# Patient Record
Sex: Female | Born: 1990 | Race: Black or African American | Hispanic: No | Marital: Single | Smoking: Never smoker
Health system: Southern US, Community
[De-identification: ages and names within clinical notes are randomized; demographics above are authoritative.]

## PROBLEM LIST (undated history)

## (undated) DIAGNOSIS — L910 Hypertrophic scar: Secondary | ICD-10-CM

## (undated) DIAGNOSIS — Z789 Other specified health status: Secondary | ICD-10-CM

## (undated) HISTORY — PX: NO PAST SURGERIES: SHX2092

## (undated) HISTORY — DX: Other specified health status: Z78.9

---
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
## 2012-09-04 IMAGING — US US RENAL KIDNEY
1 series · 17 of 25 positions shown · non-contrast
Comparison: None

REASON FOR EXAM: pain - left flank/back - hematuria - eval for stone
COMMENTS:

PROCEDURE:     US  - US KIDNEY  - March 02, 2011  [DATE]
RESULT:     Indication: Left flank pain

[Series 1: us renal kidney · 17 of 47 slices shown]
[im 1/47]
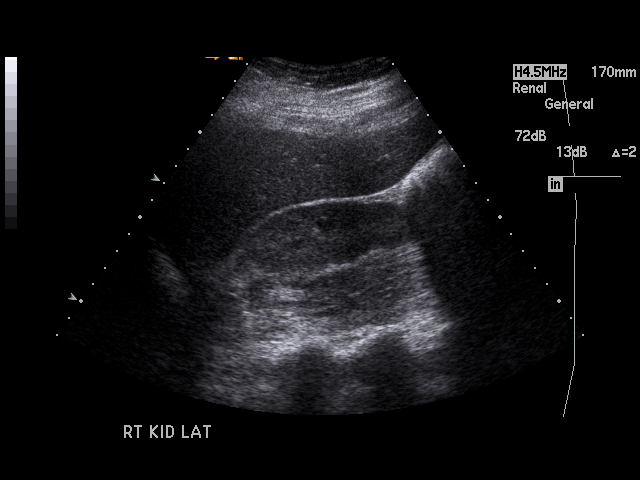
[im 4/47]
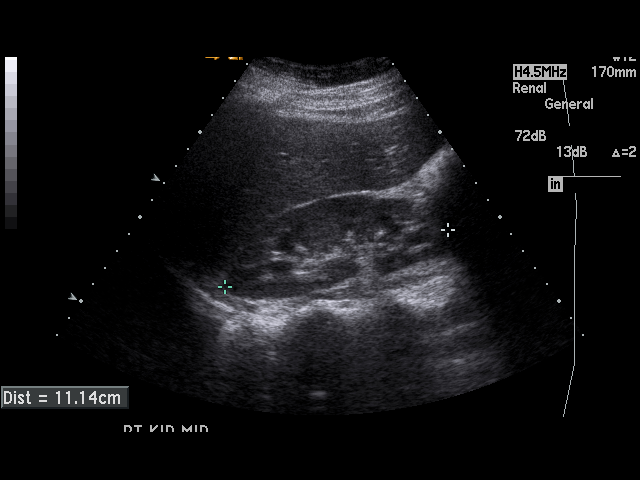
[im 6/47]
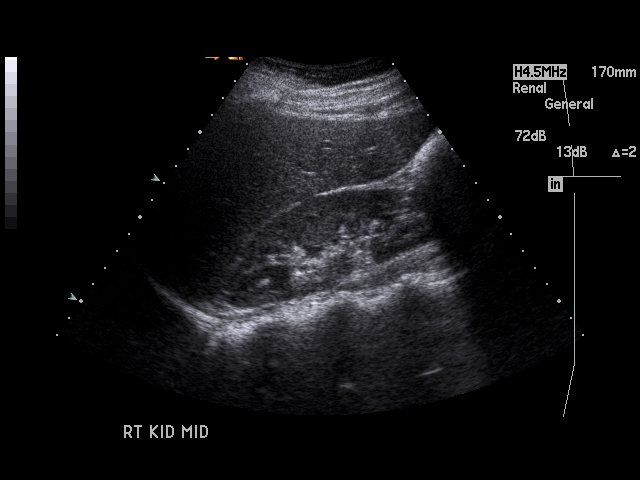
[im 10/47]
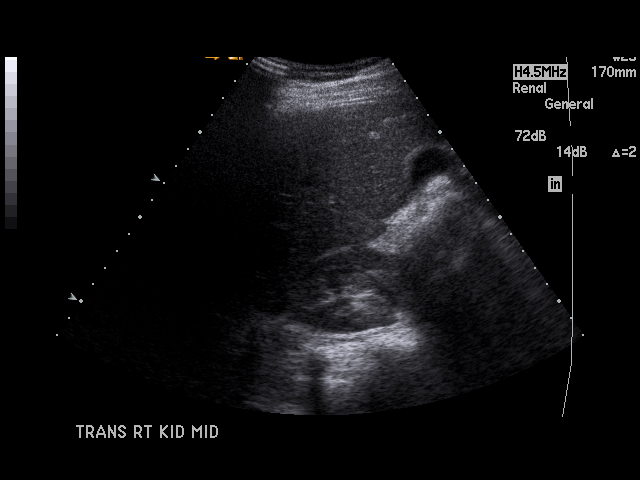
[im 12/47]
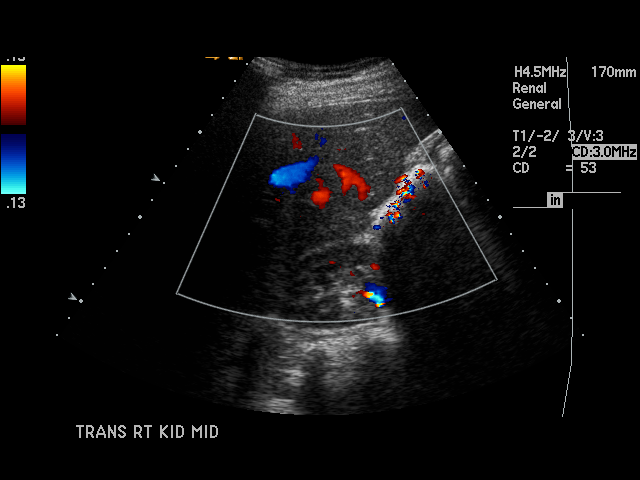
[im 16/47]
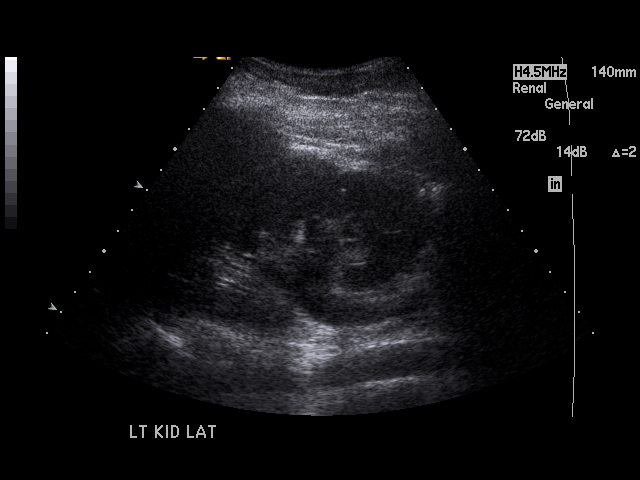
[im 18/47]
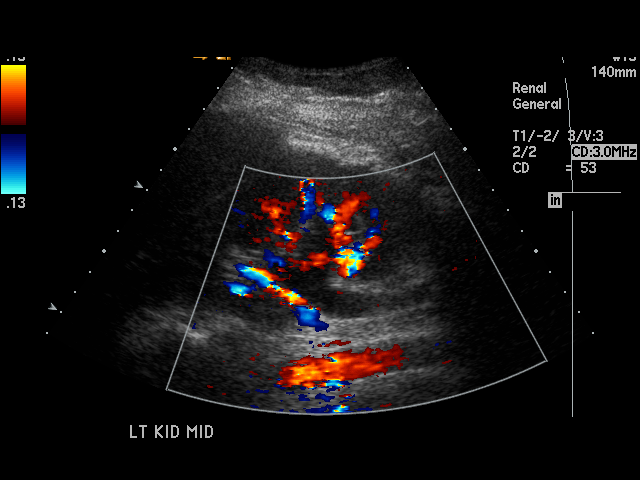
[im 22/47]
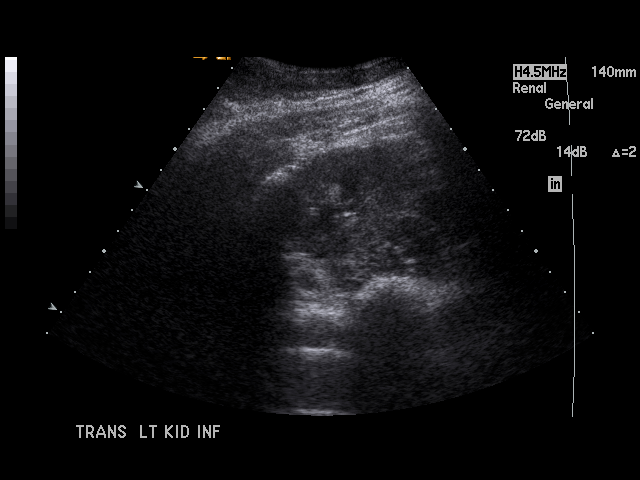
[im 24/47]
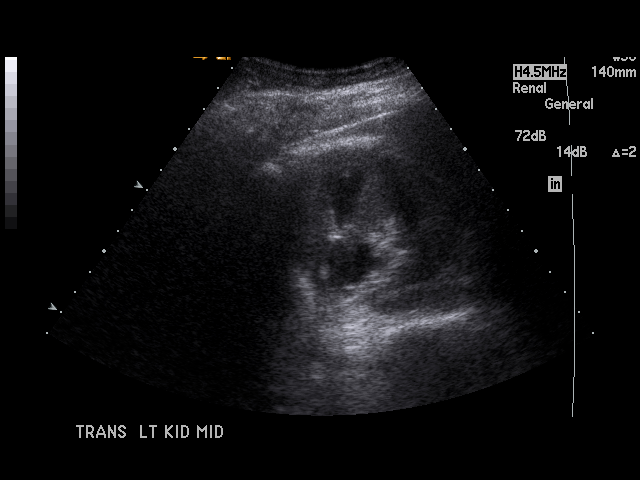
[im 25/47]
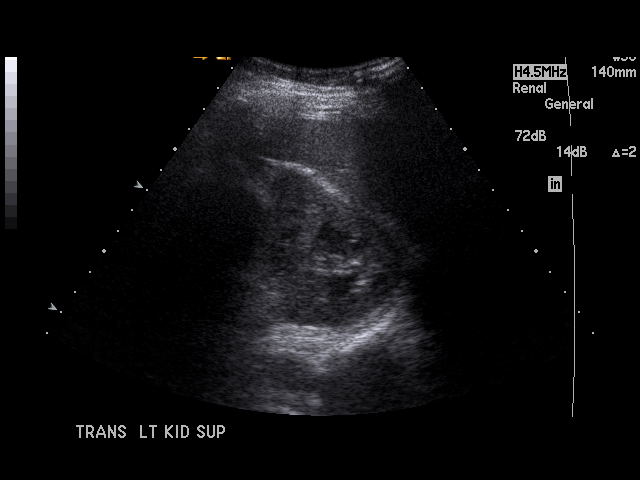
[im 29/47]
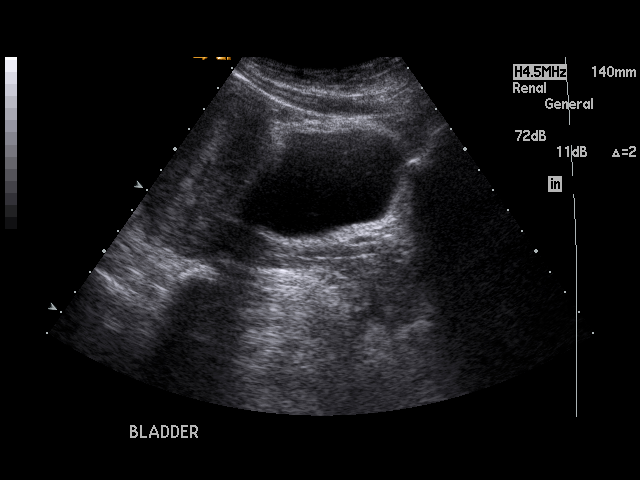
[im 31/47]
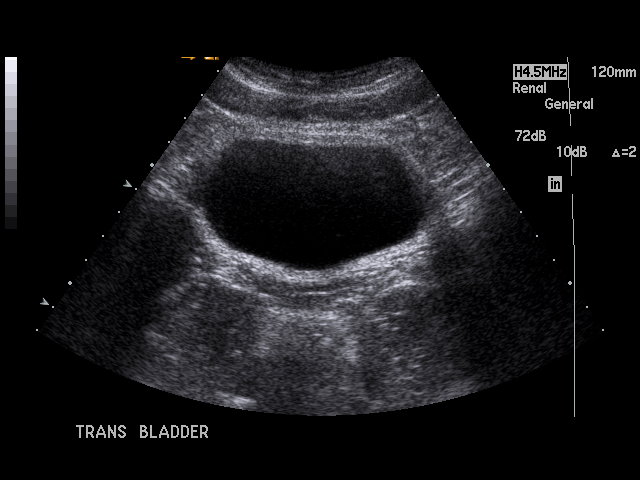
[im 35/47]
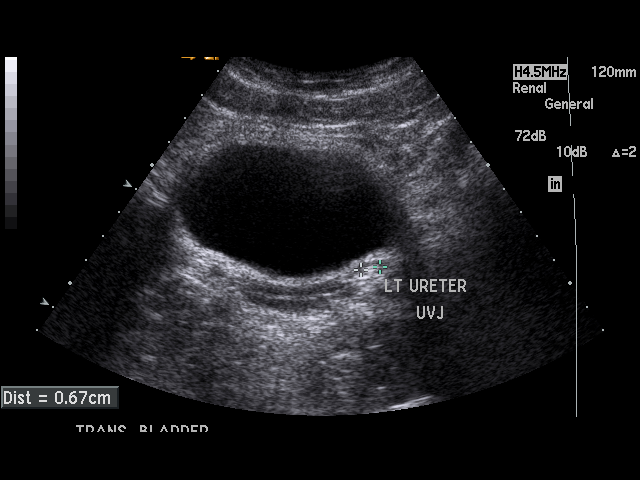
[im 37/47]
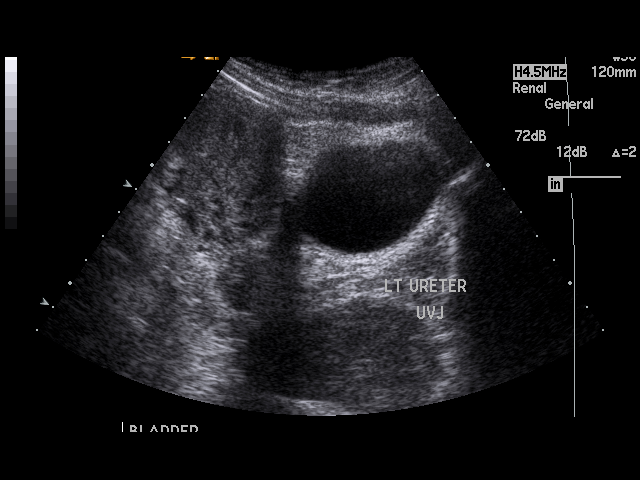
[im 41/47]
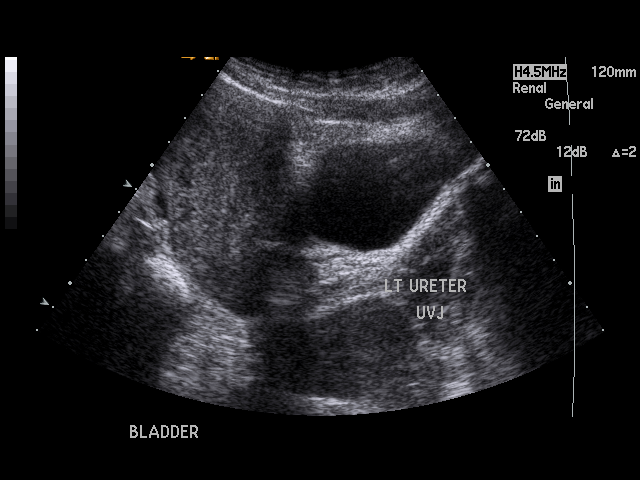
[im 43/47]
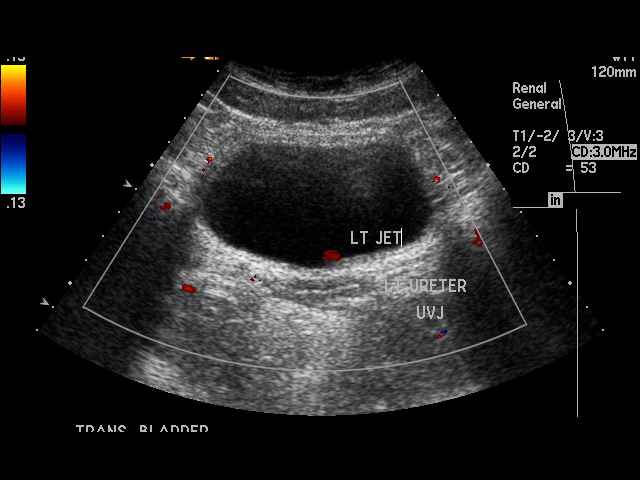
[im 47/47]
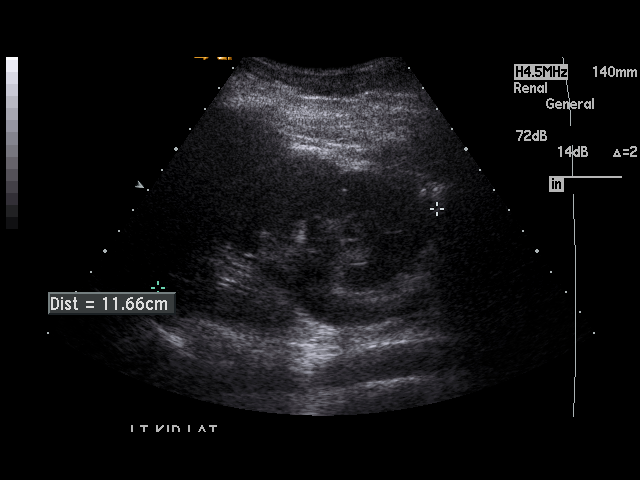

[17 of 25 positions shown; findings below may reference images not displayed]

Technique and findings: Multiple gray-scale and color doppler images of the
kidneys were obtained.

The right kidney measures 11.1 x 4.3 x 5.2 cm and the left kidney measures
11.7 x 7 x 6.5 for cm. The kidneys are normal in echogenicity. There is a 7
mm echogenic focus in the region of the left ureterovesicular junction most
consistent with a left UVJ calculus measuring 7 mm. There is mild-moderate
left hydronephrosis.  There are no renal masses. There is no free fluid in
the region of the renal fossa.
IMPRESSION: 7 mm left ureterovesicular junction calculus resulting in mild-moderate
hydronephrosis.

## 2017-01-28 ENCOUNTER — Emergency Department
Admission: EM | Admit: 2017-01-28 | Discharge: 2017-01-28 | Disposition: A | Discharge status: Home / Self Care | Payer: Self-pay | Attending: Emergency Medicine | Admitting: Emergency Medicine

## 2017-01-28 ENCOUNTER — Encounter: Payer: Self-pay | Admitting: Emergency Medicine

## 2017-01-28 DIAGNOSIS — H60391 Other infective otitis externa, right ear: Secondary | ICD-10-CM | POA: Insufficient documentation

## 2017-01-28 DIAGNOSIS — L910 Hypertrophic scar: Secondary | ICD-10-CM | POA: Insufficient documentation

## 2017-01-28 HISTORY — DX: Hypertrophic scar: L91.0

## 2017-01-28 MED ORDER — CEPHALEXIN 500 MG PO CAPS: 500.0000 mg | ORAL_CAPSULE | Freq: Three times a day (TID) | ORAL | 0 refills | Status: DC

## 2017-01-28 MED ORDER — FLUCONAZOLE 150 MG PO TABS: ORAL_TABLET | ORAL | 0 refills | Status: DC

## 2017-01-28 NOTE — ED Triage Notes (Signed)
Pt has known keloid to right ear that has gotten larger and is painful.  Has not seen ENT.  NAD. Ambulatory without difficulty.  No fevers.

## 2017-01-28 NOTE — ED Provider Notes (Signed)
[facility] Emergency Department Provider Note   ____________________________________________   First MD Initiated Contact with Patient 01/28/17 1320     (approximate)  I have reviewed the triage vital signs and the nursing notes.   HISTORY  Chief Complaint keloid   HPI Angel Hodges is a 26 y.o. female . Complaint of tender, more swollen keloid to her right earlobe. Patient states that this began 2 days ago. She has never had pain or discomfort such as this in the past. She denies any recent injury to the area. She is unaware of any fever or chills. Currently she rates her pain as 5/10.   Past Medical History:  Diagnosis Date  . Keloid     There are no active problems to display for this patient.   History reviewed. No pertinent surgical history.  Prior to Admission medications   Medication Sig Start Date End Date Taking? Authorizing Provider  cephALEXin (KEFLEX) 500 MG capsule Take 1 capsule (500 mg total) by mouth 3 (three) times daily. 01/28/17   Tommi Rumps, PA-C  fluconazole (DIFLUCAN) 150 MG tablet One tablet if needed for yeast infection 01/28/17   Tommi Rumps, PA-C    Allergies Patient has no known allergies.  History reviewed. No pertinent family history.  Social History Social History  Substance Use Topics  . Smoking status: Never Smoker  . Smokeless tobacco: Never Used  . Alcohol use No    Review of Systems Constitutional: No fever/chills Eyes: No visual changes. ENT: Positive for right leg pain. Cardiovascular: Denies chest pain. Respiratory: Denies shortness of breath. Skin: Positive for keloid. Neurological: Negative for headaches ___________________________________________   PHYSICAL EXAM:  VITAL SIGNS: ED Triage Vitals  Enc Vitals Group     BP 01/28/17 1258 132/73     Pulse Rate 01/28/17 1256 78     Resp 01/28/17 1256 18     Temp 01/28/17 1256 99.4 F (37.4 C)     Temp Source 01/28/17 1256  Oral     SpO2 01/28/17 1256 100 %     Weight 01/28/17 1257 250 lb (113.4 kg)     Height 01/28/17 1257  (1.702 m)     Head Circumference --      Peak Flow --      Pain Score 01/28/17 1256 5     Pain Loc --      Pain Edu? --      Excl. in GC? --    Constitutional: Alert and oriented. Well appearing and in no acute distress. Eyes: Conjunctivae are normal.  Head: Atraumatic. Nose: No congestion/rhinnorhea. Neck: No stridor.   Hematological/Lymphatic/Immunilogical: No cervical lymphadenopathy. Cardiovascular: Normal rate, regular rhythm. Grossly normal heart sounds.  Good peripheral circulation. Respiratory: Normal respiratory effort.  No retractions. Lungs CTAB. Neurologic:  Normal speech and language. No gross focal neurologic deficits are appreciated.  Skin:  Skin is warm, dry.   Keloid is present posterior lead secondary to having her ear is pierced. There is erythema at the base with minimal white exudate. There is tender to palpation. There is no active drainage present. Psychiatric: Mood and affect are normal. Speech and behavior are normal. ____________________________________________   LABS (all labs ordered are listed, but only abnormal results are displayed)  Labs Reviewed - No data to display  PROCEDURES  Procedure(s) performed: None  Procedures  Critical Care performed: No  ____________________________________________   INITIAL IMPRESSION / ASSESSMENT AND PLAN / ED COURSE  Pertinent labs & imaging results  that were available during my care of the patient were reviewed by me and considered in my medical decision making (see chart for details).  Patient was given a prescription for Keflex 500 mg 3 times a day for 10 days. She is also given a prescription for Diflucan for yeast if needed. Patient was encouraged to follow-up with one of the dermatologist listed on her discharge papers if any continued problems. She may also take Tylenol or ibuprofen as did it for  pain.   ___________________________________________   FINAL CLINICAL IMPRESSION(S) / ED DIAGNOSES  Final diagnoses:  Skin of right earlobe with infection  Keloid of skin      NEW MEDICATIONS STARTED DURING THIS VISIT:  New Prescriptions   CEPHALEXIN (KEFLEX) 500 MG CAPSULE    Take 1 capsule (500 mg total) by mouth 3 (three) times daily.   FLUCONAZOLE (DIFLUCAN) 150 MG TABLET    One tablet if needed for yeast infection     Note:  This document was prepared using Dragon voice recognition software and may include unintentional dictation errors.    Tommi Rumps, PA-C 01/28/17 1412    Governor Rooks, MD 01/28/17 [phone]

## 2017-01-28 NOTE — ED Notes (Signed)
See triage note  Presents with pain to right ear   States she has a known keloid to area but over the past couple of days area has gotten larger and more painful

## 2017-01-28 NOTE — Discharge Instructions (Signed)
Patient taking antibiotics as directed until completely finished. You may use warm compresses to your ear as needed. Follow-up with your primary care doctor if any continued problems. A prescription for Diflucan is also written in case you do have symptoms of a yeast infection with this antibiotic. Follow-up with one of the above dermatologist listed on your discharge papers if any continued problems.

## 2017-05-08 NOTE — L&D Delivery Note (Addendum)
Patient: Angel Hodges MRN: [id number]  GBS status: positive, IAP given penicillin x3  Patient is a 27 y.o. now [id number] s/p NSVD at 8527w6d, who was admitted for IOL with cytotec and pitocin for HTN. AROM 0h 2276m prior to delivery with clear fluid.    Delivery Note At 2:57 AM a viable female was delivered via Vaginal, Spontaneous (Presentation: vertex ; LOA ).  APGAR: 8, 9; weight pending .   Placenta status: intact.  Cord: 3 vessel with the following complications: .  Cord pH: pending  Anesthesia:  Epidural placed Episiotomy: None Lacerations: 1st degree Suture Repair: 3.0 vicryl Est. Blood Loss (mL):    Mom to postpartum.  Baby to Couplet care / Skin to Skin.  Angel Hodges 04/11/2018, 3:26 AM   Head delivered LOA. No nuchal cord present. Shoulder and body delivered in usual fashion. Infant with spontaneous cry, placed on mother's abdomen, dried and bulb suctioned. Cord clamped x 2 after 1-minute delay, and cut by family member. Cord blood drawn. Placenta delivered spontaneously with gentle cord traction. Fundus firm with massage and Pitocin. Perineum inspected and found to have 1st degree laceration, which was repaired with ~6 sutures with good hemostasis achieved.  I confirm that I have verified the information documented in the resident's note and that I have also personally reperformed the physical exam and all medical decision making activities.  I was gloved and present for entire delivery SVD without incident No difficulty with shoulders Lacerations as listed above Repair of same supervised by me Angel Hodges, CNM  Please schedule this patient for Postpartum visit in: 1 week with the following provider: Any provider For C/S patients schedule nurse incision check in weeks 2 weeks: no High risk pregnancy complicated by: HTN Delivery mode:  SVD Anticipated Birth Control:  BTL done PP PP Procedures needed: BP check  Schedule Integrated BH visit: no

## 2018-01-02 ENCOUNTER — Encounter (HOSPITAL_COMMUNITY): Payer: Self-pay | Admitting: Emergency Medicine

## 2018-01-02 ENCOUNTER — Emergency Department (HOSPITAL_COMMUNITY): Payer: Medicaid Other

## 2018-01-02 ENCOUNTER — Other Ambulatory Visit: Payer: Self-pay

## 2018-01-02 ENCOUNTER — Emergency Department (HOSPITAL_COMMUNITY)
Admission: EM | Admit: 2018-01-02 | Discharge: 2018-01-02 | Disposition: A | Discharge status: Home / Self Care | Payer: Medicaid Other | Attending: Emergency Medicine | Admitting: Emergency Medicine

## 2018-01-02 DIAGNOSIS — O9989 Other specified diseases and conditions complicating pregnancy, childbirth and the puerperium: Secondary | ICD-10-CM | POA: Diagnosis present

## 2018-01-02 DIAGNOSIS — R1010 Upper abdominal pain, unspecified: Secondary | ICD-10-CM | POA: Diagnosis not present

## 2018-01-02 DIAGNOSIS — Z3A28 28 weeks gestation of pregnancy: Secondary | ICD-10-CM | POA: Insufficient documentation

## 2018-01-02 DIAGNOSIS — R109 Unspecified abdominal pain: Secondary | ICD-10-CM

## 2018-01-02 DIAGNOSIS — O26899 Other specified pregnancy related conditions, unspecified trimester: Secondary | ICD-10-CM

## 2018-01-02 LAB — CBC
HEMATOCRIT: 35.1 % — AB (ref 36.0–46.0)
HEMOGLOBIN: 11.2 g/dL — AB (ref 12.0–15.0)
MCH: 25.7 pg — ABNORMAL LOW (ref 26.0–34.0)
MCHC: 31.9 g/dL (ref 30.0–36.0)
MCV: 80.5 fL (ref 78.0–100.0)
Platelets: 269 10*3/uL (ref 150–400)
RBC: 4.36 MIL/uL (ref 3.87–5.11)
RDW: 16.4 % — ABNORMAL HIGH (ref 11.5–15.5)
WBC: 13 10*3/uL — ABNORMAL HIGH (ref 4.0–10.5)

## 2018-01-02 LAB — URINALYSIS, ROUTINE W REFLEX MICROSCOPIC
BILIRUBIN URINE: NEGATIVE
Glucose, UA: NEGATIVE mg/dL
Hgb urine dipstick: NEGATIVE
Ketones, ur: NEGATIVE mg/dL
Nitrite: NEGATIVE
PROTEIN: NEGATIVE mg/dL
SPECIFIC GRAVITY, URINE: 1.017 (ref 1.005–1.030)
pH: 6 (ref 5.0–8.0)

## 2018-01-02 LAB — BASIC METABOLIC PANEL
ANION GAP: 14 (ref 5–15)
BUN: <5 mg/dL — ABNORMAL LOW (ref 6–20)
CALCIUM: 9.6 mg/dL (ref 8.9–10.3)
CO2: 20 mmol/L — AB (ref 22–32)
CREATININE: 0.54 mg/dL (ref 0.44–1.00)
Chloride: 107 mmol/L (ref 98–111)
GFR calc Af Amer: >60 mL/min (ref >60)
GLUCOSE: 104 mg/dL — AB (ref 70–99)
Potassium: 3.6 mmol/L (ref 3.5–5.1)
Sodium: 141 mmol/L (ref 135–145)

## 2018-01-02 LAB — I-STAT BETA HCG BLOOD, ED (MC, WL, AP ONLY): HCG, QUANTITATIVE: 1769.9 m[IU]/mL — AB (ref <5)

## 2018-01-02 MED ORDER — PRENATAL COMPLETE 14-0.4 MG PO TABS: 1.0000 | ORAL_TABLET | Freq: Every day | ORAL | 0 refills | Status: DC

## 2018-01-02 NOTE — ED Provider Notes (Signed)
[facility] Provider Note   CSN: [id number] Arrival date & time: 01/02/18  1746     History   Chief Complaint Chief Complaint  Patient presents with  . Abdominal Pain  . Pregnancy test    HPI Angel Hodges is a 27 y.o. female.  HPI   27 year old female presents today with complaints of upper abdominal pain. Patient notes that her left upper quadrant intermittently hurts. She notes that she gets a slight sharp sensation when she rolls in certain positions, this is improved with positioning. Patient denies any abdominal pain at the time my evaluation. She denies any nausea or vomiting, denies any lower abdominal pain vaginal bleeding or discharge. She notes that her last partial cycle was approximately 4 months ago giver take. She denies any chest pain, headache, shortness of breath, or urinary symptoms.  Past Medical History:  Diagnosis Date  . Keloid     There are no active problems to display for this patient.   History reviewed. No pertinent surgical history.   OB History   None      Home Medications    Prior to Admission medications   Medication Sig Start Date End Date Taking? Authorizing Provider  cephALEXin (KEFLEX) 500 MG capsule Take 1 capsule (500 mg total) by mouth 3 (three) times daily. 01/28/17   Tommi RumpsSummers, Rhonda L, PA-C  fluconazole (DIFLUCAN) 150 MG tablet One tablet if needed for yeast infection 01/28/17   Tommi RumpsSummers, Rhonda L, PA-C    Family History No family history on file.  Social History Social History   Tobacco Use  . Smoking status: Never Smoker  . Smokeless tobacco: Never Used  Substance Use Topics  . Alcohol use: No  . Drug use: No     Allergies   Patient has no known allergies.   Review of Systems Review of Systems  All other systems reviewed and are negative.  Physical Exam Updated Vital Signs BP (!) 141/87 (BP Location: Right Arm)   Pulse 95   Temp 98.2 F (36.8 C) (Oral)   Resp  16   SpO2 98%   Physical Exam  Constitutional: She is oriented to person, place, and time. She appears well-developed and well-nourished.  HENT:  Head: Normocephalic and atraumatic.  Eyes: Pupils are equal, round, and reactive to light. Conjunctivae are normal. Right eye exhibits no discharge. Left eye exhibits no discharge. No scleral icterus.  Neck: Normal range of motion. No JVD present. No tracheal deviation present.  Pulmonary/Chest: Effort normal. No stridor.  Abdominal:  Obese abdomen- fundus palpated above the umbilicus- abdomen nontender to palpation with no rigidity  Neurological: She is alert and oriented to person, place, and time. Coordination normal.  Psychiatric: She has a normal mood and affect. Her behavior is normal. Judgment and thought content normal.  Nursing note and vitals reviewed.    ED Treatments / Results  Labs (all labs ordered are listed, but only abnormal results are displayed) Labs Reviewed  CBC - Abnormal; Notable for the following components:      Result Value   WBC 13.0 (*)    Hemoglobin 11.2 (*)    HCT 35.1 (*)    MCH 25.7 (*)    RDW 16.4 (*)    All other components within normal limits  URINALYSIS, ROUTINE W REFLEX MICROSCOPIC - Abnormal; Notable for the following components:   APPearance HAZY (*)    Leukocytes, UA MODERATE (*)    Bacteria, UA RARE (*)  All other components within normal limits  BASIC METABOLIC PANEL - Abnormal; Notable for the following components:   CO2 20 (*)    Glucose, Bld 104 (*)    BUN <5 (*)    All other components within normal limits  I-STAT BETA HCG BLOOD, ED ([facility], [facility], [facility] ONLY) - Abnormal; Notable for the following components:   I-stat hCG, quantitative 1,769.9 (*)    All other components within normal limits    EKG None  Radiology No results found.  Procedures Procedures (including critical care time)  Medications Ordered in ED Medications - No data to display   Initial Impression / Assessment  and Plan / ED Course  I have reviewed the triage vital signs and the nursing notes.  Pertinent labs & imaging results that were available during my care of the patient were reviewed by me and considered in my medical decision making (see chart for details).     Labs: urinalysis, I-STAT beta hCG, CBC  Imaging:OB Limited  Consults:  Therapeutics:  Discharge Meds:   Assessment/Plan: 27 year old female presents today with pregnancy. Patient is [redacted] weeks pregnant, she has upper abdominal pain that is not present now, low suspicion for any acute intra-abdominal pathology. Patient does not have prenatal care, message was sent to [facility] clinic for close follow-up. Initial blood pressure was slightly elevated, repeat blood pressure within normal limits, no protein in the area or signs of preeclampsia. Patient will be discharged with close follow-up information, stricture, cautions. She verbalized understanding and agreement to today's plan.    Final Clinical Impressions(s) / ED Diagnoses   Final diagnoses:  Abdominal pain in pregnancy    ED Discharge Orders    None       Rosalio Loud 01/03/18 Daiva Eves, MD 01/03/18 1943

## 2018-01-02 NOTE — ED Provider Notes (Signed)
Patient placed in Quick Look pathway, seen and evaluated   Chief Complaint: abdominal pain  HPI:   Gerarda Fractionasha D Bond is a 27 y.o. female who present to the ED with left side abdominal pain and feeling like butterflies in her stomach. LMP over 4 months ago.  ROS: GI: abdominal pain  Physical Exam:  BP (!) 141/87 (BP Location: Right Arm)   Pulse 95   Temp 98.2 F (36.8 C) (Oral)   Resp 16   SpO2 98%    Gen: No distress  Neuro: Awake and Alert  Skin: Warm and dry  Abdomen: obese, minimal tenderness with palpation, no guarding or rebound.    Initiation of care has begun. The patient has been counseled on the process, plan, and necessity for staying for the completion/evaluation, and the remainder of the medical screening examination    Janne Napoleoneese, Deola Rewis M, NP 01/02/18 1840    Tilden Fossaees, Elizabeth, MD 01/03/18 1505

## 2018-01-02 NOTE — Discharge Instructions (Addendum)
Please read attached information. If you experience any new or worsening signs or symptoms please return to the emergency room for evaluation. Please follow-up with your primary care provider or specialist as discussed. Please use medication prescribed only as directed and discontinue taking if you have any concerning signs or symptoms.   °

## 2018-01-02 NOTE — ED Triage Notes (Signed)
C/o intermittent sharp L side pain x 2 weeks and feeling like "butterflies" in her abd.  LMP 4-5 months ago.  Has not taken a home pregnancy test.

## 2018-01-07 ENCOUNTER — Encounter: Payer: Self-pay | Admitting: Nurse Practitioner

## 2018-01-07 DIAGNOSIS — O0933 Supervision of pregnancy with insufficient antenatal care, third trimester: Secondary | ICD-10-CM | POA: Insufficient documentation

## 2018-01-08 ENCOUNTER — Ambulatory Visit: Payer: Medicaid Other | Admitting: Clinical

## 2018-01-08 ENCOUNTER — Encounter: Payer: Self-pay | Admitting: Nurse Practitioner

## 2018-01-08 ENCOUNTER — Ambulatory Visit (INDEPENDENT_AMBULATORY_CARE_PROVIDER_SITE_OTHER): Payer: Medicaid Other | Admitting: Nurse Practitioner

## 2018-01-08 ENCOUNTER — Other Ambulatory Visit (HOSPITAL_COMMUNITY)
Admission: RE | Admit: 2018-01-08 | Discharge: 2018-01-08 | Disposition: A | Discharge status: Home / Self Care | Payer: Medicaid Other | Source: Ambulatory Visit | Attending: Nurse Practitioner | Admitting: Nurse Practitioner

## 2018-01-08 VITALS — BP 108/81 | HR 96 | Wt 286.4 lb

## 2018-01-08 DIAGNOSIS — Z3480 Encounter for supervision of other normal pregnancy, unspecified trimester: Secondary | ICD-10-CM

## 2018-01-08 DIAGNOSIS — Z3A29 29 weeks gestation of pregnancy: Secondary | ICD-10-CM | POA: Insufficient documentation

## 2018-01-08 DIAGNOSIS — Z3483 Encounter for supervision of other normal pregnancy, third trimester: Secondary | ICD-10-CM | POA: Diagnosis present

## 2018-01-08 DIAGNOSIS — O0933 Supervision of pregnancy with insufficient antenatal care, third trimester: Secondary | ICD-10-CM

## 2018-01-08 MED ORDER — VITAFOL ULTRA 29-0.6-0.4-200 MG PO CAPS: 1.0000 | ORAL_CAPSULE | Freq: Every day | ORAL | 11 refills | Status: AC

## 2018-01-08 NOTE — BH Specialist Note (Signed)
  Integrated Behavioral Health Initial Visit  MRN: [id number] Name: Angel Hodges  Number of Integrated Behavioral Health Clinician visits:: 1/6 Session Start time: 9:58  Session End time: 10:07 Total time: 9 minutes  Type of Service: Integrated Behavioral Health- Individual/Family Interpretor:No. Interpretor Name and Language: n/a   Warm Hand Off Completed.       SUBJECTIVE: Angel Hodges is a 27 y.o. female accompanied by n/a Patient was referred by Nolene Bernheim, NP for Initial OB introduction to integrated behavioral health services . Patient reports the following symptoms/concerns: Pt states mild sleep difficulty falling asleep today, and being unfamiliar with [facility]; no other concerns today. Duration of problem: Current pregnancy; Severity of problem: mild  OBJECTIVE: Mood: Normal and Affect: Appropriate Risk of harm to self or others: No plan to harm self or others  LIFE CONTEXT: Family and Social: - School/Work: - Self-Care: - Life Changes: Current pregnancy   GOALS ADDRESSED: Patient will: 1. Reduce symptoms of: mild sleep difficulty 2. Increase knowledge and/or ability of: healthy habits  3. Demonstrate ability to: Increase healthy adjustment to current life circumstances  INTERVENTIONS: Interventions utilized: Supportive Counseling  Standardized Assessments completed: GAD-7 and PHQ 9  ASSESSMENT: Patient currently experiencing Supervision of other normal  pregnancy, antepartum .   Patient may benefit from Initial OB introduction to integrated behavioral health services, and brief therapeutic intervention regarding mild sleep issues .  PLAN: 1. Follow up with behavioral health clinician on : As needed 2. Behavioral recommendations:  -Use sleep sound/ app for improved sleep  -Take Postpartum Planner today -Consider online virtual tour of [facility] 3. Referral(s): Integrated [employer] (In Clinic) 4. "From scale of  1-10, how likely are you to follow plan?": 10  Rae Lips, LCSW  Depression screen [facility] 2/9 01/08/2018  Decreased Interest 0  Down, Depressed, Hopeless 0  PHQ - 2 Score 0  Altered sleeping 2  Tired, decreased energy 2  Change in appetite 1  Feeling bad or failure about yourself  0  Trouble concentrating 0  Moving slowly or fidgety/restless 1  Suicidal thoughts 0  PHQ-9 Score 6   GAD 7 : Generalized Anxiety Score 01/08/2018  Nervous, Anxious, on Edge 0  Control/stop worrying 0  Worry too much - different things 0  Trouble relaxing 1  Restless 0  Easily annoyed or irritable 1  Afraid - awful might happen 0  Total GAD 7 Score 2

## 2018-01-08 NOTE — Progress Notes (Signed)
Subjective:   Angel Hodges is a 27 y.o. G1P0001 at [redacted]w[redacted]d by LMP being seen today for her first obstetrical visit.  Her obstetrical history is significant for late to care as she did not realize she was pregnant.  Menses are irregular.   Found out about pregnancy at a visit to the ER for abdominal pain on January 02, 2018.   Patient does intend to breast feed.  Pregnancy history fully reviewed.  Patient reports no complaints.  HISTORY: OB History  Gravida Para Term Preterm AB Living  2 1 1  0 0 1  SAB TAB Ectopic Multiple Live Births  0 0 0 0 1    # Outcome Date GA Lbr Len/2nd Weight Sex Delivery Anes PTL Lv  2 Current           1 Term            Past Medical History:  Diagnosis Date  . Keloid   . Medical history non-contributory    Past Surgical History:  Procedure Laterality Date  . NO PAST SURGERIES     History reviewed. No pertinent family history. Social History   Tobacco Use  . Smoking status: Never Smoker  . Smokeless tobacco: Never Used  Substance Use Topics  . Alcohol use: No  . Drug use: No   No Known Allergies Current Outpatient Medications on File Prior to Visit  Medication Sig Dispense Refill  . Prenatal Vit-Fe Fumarate-FA (PRENATAL COMPLETE) 14-0.4 MG TABS Take 1 tablet by mouth daily. 60 each 0   No current facility-administered medications on file prior to visit.      Exam   Vitals:   01/08/18 0853  BP: 108/81  Pulse: 96  Weight: 286 lb 6.4 oz (129.9 kg)   Fetal Heart Rate (bpm): 145  Uterus:     Pelvic Exam: Perineum: no hemorrhoids, normal perineum   Vulva: normal external genitalia, no lesions   Vagina:  normal mucosa, normal discharge   Cervix: no lesions and normal, pap smear done. Cervix closed. Small amount of pale yellow vaginal discharge seen.   Adnexa: normal adnexa and no mass, fullness, tenderness   Bony Pelvis: average  System: General: well-developed, well-nourished female in no acute distress   Breast:  Large  breasts, normal appearance, no masses or tenderness   Skin: normal coloration and turgor, no rashes   Neurologic: oriented, normal, negative, normal mood   Extremities: normal strength, tone, and muscle mass, ROM of all joints is normal   HEENT extraocular movement intact and sclera clear, anicteric   Mouth/Teeth mucous membranes moist, pharynx normal without lesions and dental hygiene good   Neck supple and no masses, normal thyroid   Cardiovascular: regular rate and rhythm   Respiratory:  no respiratory distress, normal breath sounds   Abdomen: soft, non-tender; no masses,  no organomegaly     Assessment:   Pregnancy: G1P0001 Patient Active Problem List   Diagnosis Date Noted  . Supervision of other normal pregnancy, antepartum 01/08/2018  . Morbid obesity (HCC) 01/08/2018  . Late prenatal care affecting pregnancy, antepartum, third trimester 01/07/2018     Plan:  1. Supervision of other normal pregnancy, antepartum Prenatal vitamins prescribed. Approximate weight for prepregnancy weight - was 250 pounds in September 2019.  - Culture, OB Urine - Cystic fibrosis gene test - Cytology - PAP - Genetic Screening - Hemoglobinopathy Evaluation - Obstetric Panel, Including HIV - SMN1 COPY NUMBER ANALYSIS (SMA Carrier Screen) - [place] MFM OB DETAIL +  14 WK; Future - Prenat-Fe Poly-Methfol-FA-DHA (VITAFOL ULTRA) 29-0.6-0.4-200 MG CAPS; Take 1 capsule by mouth daily.  Dispense: 30 capsule; Refill: 11  2. Late prenatal care affecting pregnancy, antepartum, third trimester Was unaware of pregnancy even though she has not been having menses.  Ususally has menses every other month.  3.  Morbid Obesity  Initial labs drawn. Continue prenatal vitamins. Genetic Screening discussed, NIPS: ordered. Ultrasound discussed; fetal anatomic survey: ordered. Problem list reviewed and updated. The nature of [place] [facility] with multiple MDs and other Advanced Practice  Providers was explained to patient; also emphasized that residents, students are part of our team. Routine obstetric precautions reviewed. Return in about 2 weeks (around 01/22/2018) for ASAP for glucola - fasting early AM and in 2 weeks for ROB.  Total face-to-face time with patient: 40 minutes.  Over 50% of encounter was spent on counseling and coordination of care.     Nolene Bernheim, FNP Family Nurse Practitioner, [facility] for [employer], [facility] 01/08/2018 1:20 PM

## 2018-01-08 NOTE — Patient Instructions (Addendum)
AREA PEDIATRIC/FAMILY PRACTICE PHYSICIANS  [facility] [address], [address] [place], [place]  [address] Phone - [phone]   Fax - [phone]  [facility] [address] [place], [place] [address] Phone - [phone]   Fax - [phone]  JACK AMOS [address] [place], [place]  [address] Phone - [phone]   Fax - [phone]  [facility] [address] [place], [place]  [address] Phone - [phone]   Fax - [phone]  [facility] [address] [place], [place]  [address] Phone - [phone]   Fax - [phone]  [facility] [address] [place], [place]  [address] Phone - [phone]   Fax - [phone]  [facility] [place] [address] [place], [place]  [address] Phone - [phone]   Fax - [phone]  [facility] AT [facility] [address] [place], [address] [place], [place]  [address] Phone - [phone]   Fax - [phone]  [facility] AT [facility] [address] [place], [place]  [address] Phone - [phone]   Fax - [phone] [facility] [place] [address] [place], [place]  [address] Phone - [phone]   Fax - [phone]  [facility] [address] [place], [place]  [address] Phone - [phone]   Fax - [phone]  [facility] [address], [address] [place], [place]  [address] Phone - [phone]   Fax - [phone]  [facility] [place] [address] [place], [place]  [address] Phone - [phone]   Fax - [phone]  [facility] [address], [address] [place], [place]  [address] Phone - [phone]  [facility] [address] [place], [place]  [address] Phone - [phone]   Fax - [phone]  [facility] [address] [place], [place]  [address] Phone - [phone]   Fax - [phone]  [facility] [address] [place], [place]  [address] Phone - [phone]   Fax - [phone]  [facility] [address] [place], [place]  [address] Phone - [phone]   Fax - [phone]   [facility] [address] [place] [address] [place], [place]  [address] Phone - [phone]   Fax - [phone]  [facility] [address] [place], [place]  [address] Phone - [phone]   Fax - [phone]  DAVID RUBIN [address] [place], [place]  [address] Phone - [phone]   Fax - [phone]  [facility] [address] [place], [place]  [address] Phone - [phone]   Fax - [phone]  [facility] Alita Chyle [address] [place], [place]  [address] Phone - [phone]   Fax - [phone] Gerarda Fraction [address] [place], [place]  [address] Phone - [phone]   Fax - [phone]  [facility] [address] [place], [place]  [address] Phone - [phone]   Fax - [phone]  [facility] [place] [address] [place], [place]  [address] Phone - [phone]   Fax - [phone]  [facility] [place] Wyvonne Lenz MD [address] [employer] [place] [address] Phone [phone]  Fax [phone] BENEFITS OF BREASTFEEDING Many women wonder if they should breastfeed. Research shows that breast milk contains  the perfect balance of vitamins, protein and fat that your baby needs to grow. It also contains antibodies that help your baby's immune system to fight off viruses and bacteria and can reduce the risk of sudden infant death syndrome (SIDS). In addition, the colostrum (a fluid secreted from the breast in the first few days after delivery) helps your newborn's digestive system to grow  and function well. Breast milk is easier to digest than formula. Also, if your baby is born preterm, breast milk can help to reduce both short- and long-term health problems. BENEFITS OF BREASTFEEDING FOR MOM . Breastfeeding causes a hormone to be released that helps the uterus to contract and return to its normal size more quickly. . It aids in postpartum weight loss, reduces risk of breast and ovarian cancer, heart disease and rheumatoid arthritis. . It decreases the amount of bleeding after the baby is born. benefits of breastfeeding for baby . Provides comfort and nutrition . Protects baby against - Obesity - Diabetes - Asthma - Childhood cancers - Heart disease - Ear infections - Diarrhea - Pneumonia - Stomach problems - Serious allergies - Skin rashes . Promotes growth and development . Reduces the risk of baby having Sudden Infant Death Syndrome (SIDS) only breastmilk for the first 6 months . Protects baby against diseases/allergies . It's the perfect amount for tiny bellies . It restores baby's energy . Provides the best nutrition for baby . Giving water or formula can make baby more likely to get sick, decrease Mom's milk supply, make baby less content with breastfeeding Skin to Skin After delivery, the staff will place your baby on your chest. This helps with the following: . Regulates baby's temperature, breathing, heart rate and blood sugar . Increases Mom's milk supply . Promotes bonding . Keeps baby and Mom calm and decreases baby's crying Rooming In Your baby will stay in your room with you for the entire time you are in the hospital. This helps with the following: . Allows Mom to learn baby's feeding cues - Fluttering eyes - Sucking on tongue or hand - Rooting (opens mouth and turns head) - Nuzzling into the breast - Bringing hand to mouth . Allows breastfeeding on demand (when your baby is ready) . Helps baby to be calm and content . Ensures a good milk  supply . Prevents complications with breastfeeding . Allows parents to learn to care for baby . Allows you to request assistance with breastfeeding Importance of a good latch . Increases milk transfer to baby - baby gets enough milk . Ensures you have enough milk for your baby . Decreases nipple soreness . Don't use pacifiers and bottles - these cause baby to suck differently than breastfeeding . Promotes continuation of breastfeeding Risks of Formula Supplementation with Breastfeeding Giving your infant formula in addition to your breast-milk EXCEPT when medically necessary can lead to: Marland Kitchen Decreases your milk supply  . Loss of confidence in yourself for providing baby's nutrition  . Engorgement and possibly mastitis  . Asthma & allergies in the baby BREASTFEEDING FAQS How long should I breastfeed my baby? It is recommended that you provide your baby with breast milk only for the first 6 months and then continue for the first year and longer as desired. During the first few weeks after birth, your baby will need to feed 8-12 times every 24 hours, or every 2-3 hours. They will likely feed for 15-30 minutes. How can I help my baby begin breastfeeding? Babies are born with  an instinct to breastfeed. A healthy baby can begin breastfeeding right away without specific help. At the hospital, a nurse (or lactation consultant) will help you begin the process and will give you tips on good positioning. It may be helpful to take a breastfeeding class before you deliver in order to know what to expect. How can I help my baby latch on? In order to assist your baby in latching-on, cup your breast in your hand and stroke your baby's lower lip with your nipple to stimulate your baby's rooting reflex. Your baby will look like he or she is yawning, at which point you should bring the baby towards your breast, while aiming the nipple at the roof of his or her mouth. Remember to bring the baby towards you and not  your breast towards the baby. How can I tell if my baby is latched-on? Your baby will have all of your nipple and part of the dark area around the nipple in his or her mouth and your baby's nose will be touching your breast. You should see or hear the baby swallowing. If the baby is not latched-on properly, start the process over. To remove the suction, insert a clean finger between your breast and the baby's mouth. Should I switch breasts during feeding? After feeding on one side, switch the baby to your other breast. If he or she does not continue feeding - that is OK. Your baby will not necessarily need to feed from both breasts in a single feeding. On the next feeding, start with the other breast for efficiency and comfort. How can I tell if my baby is hungry? When your baby is hungry, they will nuzzle against your breast, make sucking noises and tongue motions and may put their hands near their mouth. Crying is a late sign of hunger, so you should not wait until this point. When they have received enough milk, they will unlatch from the breast. Is it okay to use a pacifier? Until your baby gets the hang of breastfeeding, experts recommend limiting pacifier usage. If you have questions about this, please contact your pediatrician. What can I do to ensure proper nutrition while breastfeeding? . Make sure that you support your own health and your baby's by eating a healthy, well-balanced diet . Your provider may recommend that you continue to take your prenatal vitamin . Drink plenty of fluids. It is a good rule to drink one glass of water before or after feeding . Alcohol will remain in the breast milk for as long as it will remain in the blood stream. If you choose to have a drink, it is recommended that you wait at least 2 hours before feeding . Moderate amounts of caffeine are OK . Some over-the-counter or prescription medications are not recommended during breastfeeding. Check with your  provider if you have questions What types of birth control methods are safe while breastfeeding? Progestin-only methods, including a daily pill, an IUD, the implant and the injection are safe while breastfeeding. Methods that contain estrogen (such as combination birth control pills, the vaginal ring and the patch) should not be used during the first month of breastfeeding as these can decrease your milk supply.        Genetic Screening Results Information: You are having genetic testing called [facility] today.  It will take approximately 2 weeks before the results are available.  To get your results, you need Internet access to a web browser to search [facility]/MyChart (the direct app  on your phone will not give you these results).  Then select Lab Scanned and click on the blue hyper link that says View Image to see your [facility] results.  You can also use the directions on the purple card given to look up your results directly on the [facility] website.    Safe Medications in Pregnancy   Acne: Benzoyl Peroxide Salicylic Acid  Backache/Headache: Tylenol: 2 regular strength every 4 hours OR              2 Extra strength every 6 hours  Colds/Coughs/Allergies: Benadryl (alcohol free) 25 mg every 6 hours as needed Breath right strips Claritin Cepacol throat lozenges Chloraseptic throat spray Cold-Eeze- up to three times per day Cough drops, alcohol free Flonase (by prescription only) Guaifenesin Mucinex Robitussin DM (plain only, alcohol free) Saline nasal spray/drops Sudafed (pseudoephedrine) & Actifed ** use only after [redacted] weeks gestation and if you do not have high blood pressure Tylenol Vicks Vaporub Zinc lozenges Zyrtec   Constipation: Colace Ducolax suppositories Fleet enema Glycerin suppositories Metamucil Milk of magnesia Miralax Senokot Smooth move tea  Diarrhea: Kaopectate Imodium A-D  *NO pepto Bismol  Hemorrhoids: Anusol Anusol HC Preparation  H Tucks  Indigestion: Tums Maalox Mylanta Zantac  Pepcid  Insomnia: Benadryl (alcohol free) 25mg  every 6 hours as needed Tylenol PM Unisom, no Gelcaps  Leg Cramps: Tums MagGel  Nausea/Vomiting:  Bonine Dramamine Emetrol Ginger extract Sea bands Meclizine  Nausea medication to take during pregnancy:  Unisom (doxylamine succinate 25 mg tablets) Take one tablet daily at bedtime. If symptoms are not adequately controlled, the dose can be increased to a maximum recommended dose of two tablets daily (1/2 tablet in the morning, 1/2 tablet mid-afternoon and one at bedtime). Vitamin B6 100mg  tablets. Take one tablet twice a day (up to 200 mg per day).  Skin Rashes: Aveeno products Benadryl cream or 25mg  every 6 hours as needed Calamine Lotion 1% cortisone cream  Yeast infection: Gyne-lotrimin 7 Monistat 7   **If taking multiple medications, please check labels to avoid duplicating the same active ingredients **take medication as directed on the label ** Do not exceed 4000 mg of tylenol in 24 hours **Do not take medications that contain aspirin or ibuprofen

## 2018-01-09 LAB — CYTOLOGY - PAP
CHLAMYDIA, DNA PROBE: NEGATIVE
Diagnosis: NEGATIVE
NEISSERIA GONORRHEA: NEGATIVE

## 2018-01-10 LAB — URINE CULTURE, OB REFLEX

## 2018-01-10 LAB — CULTURE, OB URINE

## 2018-01-14 ENCOUNTER — Encounter: Payer: Self-pay | Admitting: *Deleted

## 2018-01-17 ENCOUNTER — Encounter: Payer: Self-pay | Admitting: Nurse Practitioner

## 2018-01-17 DIAGNOSIS — Z283 Underimmunization status: Secondary | ICD-10-CM | POA: Insufficient documentation

## 2018-01-17 DIAGNOSIS — Z2839 Other underimmunization status: Secondary | ICD-10-CM | POA: Insufficient documentation

## 2018-01-17 DIAGNOSIS — O9989 Other specified diseases and conditions complicating pregnancy, childbirth and the puerperium: Secondary | ICD-10-CM

## 2018-01-17 LAB — HEMOGLOBINOPATHY EVALUATION
Ferritin: 20 ng/mL (ref 15–150)
HGB A: 97.8 % (ref 96.4–98.8)
HGB F QUANT: 0 % (ref 0.0–2.0)
HGB S: 0 %
Hgb A2 Quant: 2.2 % (ref 1.8–3.2)
Hgb C: 0 %
Hgb Solubility: NEGATIVE
Hgb Variant: 0 %

## 2018-01-17 LAB — OBSTETRIC PANEL, INCLUDING HIV
ANTIBODY SCREEN: NEGATIVE
BASOS: 0 %
Basophils Absolute: 0 10*3/uL (ref 0.0–0.2)
EOS (ABSOLUTE): 0.4 10*3/uL (ref 0.0–0.4)
EOS: 3 %
HEMATOCRIT: 35.6 % (ref 34.0–46.6)
HEMOGLOBIN: 11.7 g/dL (ref 11.1–15.9)
HEP B S AG: NEGATIVE
HIV Screen 4th Generation wRfx: NONREACTIVE
IMMATURE GRANULOCYTES: 0 %
Immature Grans (Abs): 0 10*3/uL (ref 0.0–0.1)
LYMPHS ABS: 1.3 10*3/uL (ref 0.7–3.1)
Lymphs: 9 %
MCH: 26 pg — AB (ref 26.6–33.0)
MCHC: 32.9 g/dL (ref 31.5–35.7)
MCV: 79 fL (ref 79–97)
Monocytes Absolute: 0.8 10*3/uL (ref 0.1–0.9)
Monocytes: 6 %
NEUTROS PCT: 82 %
Neutrophils Absolute: 11.3 10*3/uL — ABNORMAL HIGH (ref 1.4–7.0)
Platelets: 259 10*3/uL (ref 150–450)
RBC: 4.5 x10E6/uL (ref 3.77–5.28)
RDW: 15.8 % — ABNORMAL HIGH (ref 12.3–15.4)
RH TYPE: POSITIVE
RPR: NONREACTIVE
Rubella Antibodies, IGG: <0.9 index — ABNORMAL LOW (ref >0.99)
WBC: 13.8 10*3/uL — AB (ref 3.4–10.8)

## 2018-01-17 LAB — SMN1 COPY NUMBER ANALYSIS (SMA CARRIER SCREENING)

## 2018-01-17 LAB — CYSTIC FIBROSIS GENE TEST

## 2018-01-17 LAB — ALPHA-THALASSEMIA

## 2018-01-18 ENCOUNTER — Encounter (HOSPITAL_COMMUNITY): Payer: Self-pay

## 2018-01-22 ENCOUNTER — Other Ambulatory Visit: Payer: Medicaid Other

## 2018-01-22 ENCOUNTER — Ambulatory Visit (INDEPENDENT_AMBULATORY_CARE_PROVIDER_SITE_OTHER): Payer: Medicaid Other | Admitting: Student

## 2018-01-22 ENCOUNTER — Other Ambulatory Visit: Payer: Self-pay | Admitting: General Practice

## 2018-01-22 VITALS — BP 127/77 | HR 82 | Wt 288.5 lb

## 2018-01-22 DIAGNOSIS — Z3480 Encounter for supervision of other normal pregnancy, unspecified trimester: Secondary | ICD-10-CM

## 2018-01-22 DIAGNOSIS — Z23 Encounter for immunization: Secondary | ICD-10-CM | POA: Diagnosis not present

## 2018-01-22 DIAGNOSIS — O368130 Decreased fetal movements, third trimester, not applicable or unspecified: Secondary | ICD-10-CM

## 2018-01-22 DIAGNOSIS — Z3483 Encounter for supervision of other normal pregnancy, third trimester: Secondary | ICD-10-CM | POA: Diagnosis present

## 2018-01-22 NOTE — Patient Instructions (Addendum)

## 2018-01-22 NOTE — Progress Notes (Addendum)
   PRENATAL VISIT NOTE  Subjective:  Angel Hodges is a 27 y.o. G2P1001 at 7559w0d being seen today for ongoing prenatal care.  She is currently monitored for the following issues for this low-risk pregnancy and has Late prenatal care affecting pregnancy, antepartum, third trimester; Supervision of other normal pregnancy, antepartum; Morbid obesity (HCC); and Rubella non-immune status, antepartum on their problem list.  Patient reports decreased fetal movements during the day. Last night she felt the baby move but not as much as it usually moves at night. .  Contractions: Not present. Vag. Bleeding: None.  Movement: Present. Denies leaking of fluid.   The following portions of the patient's history were reviewed and updated as appropriate: allergies, current medications, past family history, past medical history, past social history, past surgical history and problem list. Problem list updated.  Objective:   Vitals:   01/22/18 0833  BP: 127/77  Pulse: 82  Weight: 288 lb 8 oz (130.9 kg)    Fetal Status: Fetal Heart Rate (bpm): 147 Fundal Height: 31 cm Movement: Present     General:  Alert, oriented and cooperative. Patient is in no acute distress.  Skin: Skin is warm and dry. No rash noted.   Cardiovascular: Normal heart rate noted  Respiratory: Normal respiratory effort, no problems with respiration noted  Abdomen: Soft, gravid, appropriate for gestational age.  Pain/Pressure: Absent     Pelvic: Cervical exam deferred        Extremities: Normal range of motion.  Edema: None  Mental Status: Normal mood and affect. Normal behavior. Normal judgment and thought content.   Assessment and Plan:  Pregnancy: G2P1001 at 6759w0d  1. Supervision of other normal pregnancy, antepartum -Confirmed Peds -Discussed that US on 9-19 will help us confirm due date -Reviewed [facility] results.  -Patient states [place] uncomplicated NSVD - Tdap vaccine greater than or equal to  7yo IM -2 hour GTT today -BP normal today 2. Decreased fetal movements in third trimester, single or unspecified fetus  - Fetal nonstress test  Preterm labor symptoms and general obstetric precautions including but not limited to vaginal bleeding, contractions, leaking of fluid and fetal movement were reviewed in detail with the patient. Please refer to After Visit Summary for other counseling recommendations.  Return in about 2 weeks (around 02/05/2018), or LROB.  Future Appointments  Date Time Provider Department Center  01/22/2018  9:15 AM Marylene LandKooistra, Alexie Samson [place], CNM WOC-WOCA WOC  01/24/2018  8:30 AM WH-MFC US 1 WH-MFCUS MFC-US    Marylene LandKathryn Lorraine Phynix Horton, CNM

## 2018-01-23 LAB — GLUCOSE TOLERANCE, 2 HOURS W/ 1HR
Glucose, 1 hour: 134 mg/dL (ref 65–179)
Glucose, 2 hour: 99 mg/dL (ref 65–152)
Glucose, Fasting: 78 mg/dL (ref 65–91)

## 2018-01-24 ENCOUNTER — Ambulatory Visit (HOSPITAL_COMMUNITY)
Admission: RE | Admit: 2018-01-24 | Discharge: 2018-01-24 | Disposition: A | Discharge status: Home / Self Care | Payer: Medicaid Other | Source: Ambulatory Visit | Attending: Nurse Practitioner | Admitting: Nurse Practitioner

## 2018-01-24 ENCOUNTER — Other Ambulatory Visit (HOSPITAL_COMMUNITY): Payer: Self-pay | Admitting: *Deleted

## 2018-01-24 ENCOUNTER — Encounter (HOSPITAL_COMMUNITY): Payer: Self-pay

## 2018-01-24 DIAGNOSIS — Z363 Encounter for antenatal screening for malformations: Secondary | ICD-10-CM | POA: Diagnosis not present

## 2018-01-24 DIAGNOSIS — O99213 Obesity complicating pregnancy, third trimester: Secondary | ICD-10-CM | POA: Diagnosis not present

## 2018-01-24 DIAGNOSIS — Z3480 Encounter for supervision of other normal pregnancy, unspecified trimester: Secondary | ICD-10-CM

## 2018-01-24 DIAGNOSIS — Z3A29 29 weeks gestation of pregnancy: Secondary | ICD-10-CM | POA: Diagnosis not present

## 2018-01-24 DIAGNOSIS — O0933 Supervision of pregnancy with insufficient antenatal care, third trimester: Secondary | ICD-10-CM | POA: Diagnosis not present

## 2018-01-24 DIAGNOSIS — Z362 Encounter for other antenatal screening follow-up: Secondary | ICD-10-CM

## 2018-01-24 DIAGNOSIS — E669 Obesity, unspecified: Secondary | ICD-10-CM | POA: Diagnosis not present

## 2018-01-24 DIAGNOSIS — Z3687 Encounter for antenatal screening for uncertain dates: Secondary | ICD-10-CM | POA: Insufficient documentation

## 2018-02-05 ENCOUNTER — Ambulatory Visit (INDEPENDENT_AMBULATORY_CARE_PROVIDER_SITE_OTHER): Payer: Medicaid Other | Admitting: Advanced Practice Midwife

## 2018-02-05 VITALS — BP 116/78 | HR 82 | Wt 290.2 lb

## 2018-02-05 DIAGNOSIS — M549 Dorsalgia, unspecified: Secondary | ICD-10-CM

## 2018-02-05 DIAGNOSIS — O9989 Other specified diseases and conditions complicating pregnancy, childbirth and the puerperium: Secondary | ICD-10-CM | POA: Diagnosis not present

## 2018-02-05 DIAGNOSIS — Z3483 Encounter for supervision of other normal pregnancy, third trimester: Secondary | ICD-10-CM

## 2018-02-05 DIAGNOSIS — Z3480 Encounter for supervision of other normal pregnancy, unspecified trimester: Secondary | ICD-10-CM

## 2018-02-05 DIAGNOSIS — Z23 Encounter for immunization: Secondary | ICD-10-CM

## 2018-02-05 MED ORDER — COMFORT FIT MATERNITY SUPP MED MISC: 1.0000 | Freq: Every day | 0 refills | Status: DC

## 2018-02-05 MED ORDER — COMFORT FIT MATERNITY SUPP MED MISC: 1.0000 | Freq: Every day | 0 refills | Status: AC

## 2018-02-05 NOTE — Progress Notes (Signed)
Subjective:    Angel Hodges is a 27 y.o. G2P1001 [redacted]w[redacted]d being seen today for her obstetrical visit.  Patient states "I feel good". Patient reports low backache today. Fetal movement: normal. Patient denies contractions, vaginal bleeding, gush of fluid, and vaginal discharge.   Objective:    BP 116/78   Pulse 82   Wt 290 lb 3.2 oz (131.6 kg)   BMI 45.45 kg/m   Physical Exam  Exam  FHT: Fetal Heart Rate (bpm): 154  Uterine Size: Fundal Height: 32 cm        Assessment:    Pregnancy:  G2P1001    Plan:    Patient Active Problem List   Diagnosis Date Noted  . Rubella non-immune status, antepartum 01/17/2018  . Supervision of other normal pregnancy, antepartum 01/08/2018  . Morbid obesity (HCC) 01/08/2018  . Late prenatal care affecting pregnancy, antepartum, third trimester 01/07/2018   1. Supervision of other normal pregnancy, antepartum - Low back pain: Discussed usage of pregnancy support belt. Patient was given a prescription for one and discussed where it could be obtained. -GBS screening at 36 weeks.  - Flu Vaccine QUAD 36+ mos IM  Follow up in 2 Weeks.

## 2018-02-05 NOTE — Progress Notes (Addendum)
   PRENATAL VISIT NOTE  Subjective:  Angel Hodges is a 27 y.o. G2P1001 at [redacted]w[redacted]d being seen today at [facility] office for ongoing prenatal care.  She is currently monitored for the following issues for this low-risk pregnancy and has Late prenatal care affecting pregnancy, antepartum, third trimester; Supervision of other normal pregnancy, antepartum; Morbid obesity (HCC); and Rubella non-immune status, antepartum on their problem list.  Patient reports backache.  Contractions: Irritability. Vag. Bleeding: None.  Movement: Present. Denies leaking of fluid.   The following portions of the patient's history were reviewed and updated as appropriate: allergies, current medications, past family history, past medical history, past social history, past surgical history and problem list. Problem list updated.  Objective:   Vitals:   02/05/18 0942  BP: 116/78  Pulse: 82  Weight: 131.6 kg    Fetal Status: Fetal Heart Rate (bpm): 154 Fundal Height: 32 cm Movement: Present     General:  Alert, oriented and cooperative. Patient is in no acute distress.  Skin: Skin is warm and dry. No rash noted.   Cardiovascular: Normal heart rate noted  Respiratory: Normal respiratory effort, no problems with respiration noted  Abdomen: Soft, gravid, appropriate for gestational age.  Pain/Pressure: Present     Pelvic: Cervical exam deferred        Extremities: Normal range of motion.  Edema: None  Mental Status: Normal mood and affect. Normal behavior. Normal judgment and thought content.   Assessment and Plan:  Pregnancy: G2P1001 at [redacted]w[redacted]d  1. Supervision of other normal pregnancy, antepartum ----Anticipatory guidance about next visits/weeks of pregnancy given. - Flu Vaccine QUAD 36+ mos IM  2. Back pain affecting pregnancy in third trimester --Rest/ice/heat/warm bath/Tylenol for pain. - Elastic Bandages & Supports (COMFORT FIT MATERNITY SUPP MED) MISC; 1 Device by Does not apply route daily.  Dispense: 1  each; Refill: 0  Preterm labor symptoms and general obstetric precautions including but not limited to vaginal bleeding, contractions, leaking of fluid and fetal movement were reviewed in detail with the patient. Please refer to After Visit Summary for other counseling recommendations.  Return in about 2 weeks (around 02/19/2018).  Future Appointments  Date Time Provider Department Center  02/20/2018 10:35 AM Currie Paris, NP WOC-WOCA WOC  02/28/2018  8:30 AM WH-MFC [place] 1 WH-MFCUS MFC-US    Sharen Counter, CNM

## 2018-02-05 NOTE — Patient Instructions (Signed)

## 2018-02-20 ENCOUNTER — Ambulatory Visit (INDEPENDENT_AMBULATORY_CARE_PROVIDER_SITE_OTHER): Payer: Medicaid Other | Admitting: Nurse Practitioner

## 2018-02-20 VITALS — BP 124/84 | HR 88 | Wt 293.3 lb

## 2018-02-20 DIAGNOSIS — O0933 Supervision of pregnancy with insufficient antenatal care, third trimester: Secondary | ICD-10-CM

## 2018-02-20 DIAGNOSIS — Z3480 Encounter for supervision of other normal pregnancy, unspecified trimester: Secondary | ICD-10-CM

## 2018-02-20 NOTE — Progress Notes (Signed)
    Subjective:  Angel Hodges is a 27 y.o. G2P1001 at [redacted]w[redacted]d being seen today for ongoing prenatal care.  She is currently monitored for the following issues for this low-risk pregnancy and has Late prenatal care affecting pregnancy, antepartum, third trimester; Supervision of other normal pregnancy, antepartum; Morbid obesity (HCC); and Rubella non-immune status, antepartum on their problem list.  Patient reports no complaints.  Contractions: Irritability. Vag. Bleeding: None.  Movement: Present. Denies leaking of fluid.   The following portions of the patient's history were reviewed and updated as appropriate: allergies, current medications, past family history, past medical history, past social history, past surgical history and problem list. Problem list updated.  Objective:   Vitals:   02/20/18 1130  BP: 124/84  Pulse: 88  Weight: 293 lb 4.8 oz (133 kg)    Fetal Status: Fetal Heart Rate (bpm): 154 Fundal Height: 36 cm Movement: Present     General:  Alert, oriented and cooperative. Patient is in no acute distress.  Skin: Skin is warm and dry. No rash noted.   Cardiovascular: Normal heart rate noted  Respiratory: Normal respiratory effort, no problems with respiration noted  Abdomen: Soft, gravid, appropriate for gestational age. Pain/Pressure: Present     Pelvic:  Cervical exam deferred        Extremities: Normal range of motion.  Edema: None  Mental Status: Normal mood and affect. Normal behavior. Normal judgment and thought content.   Urinalysis:      Assessment and Plan:  Pregnancy: G2P1001 at [redacted]w[redacted]d  1. Supervision of other normal pregnancy, antepartum Doing well.  2. Late prenatal care affecting pregnancy, antepartum, third trimester Reviewed next expected appointments.  Preterm labor symptoms and general obstetric precautions including but not limited to vaginal bleeding, contractions, leaking of fluid and fetal movement were reviewed in detail with the  patient. Please refer to After Visit Summary for other counseling recommendations.  Return in about 2 weeks (around 03/06/2018).  Nolene Bernheim, RN, MSN, NP-BC Nurse Practitioner, [facility] for [employer], [facility] 02/20/2018 1:01 PM

## 2018-02-28 ENCOUNTER — Encounter (HOSPITAL_COMMUNITY): Payer: Self-pay

## 2018-02-28 ENCOUNTER — Ambulatory Visit (HOSPITAL_COMMUNITY)
Admission: RE | Admit: 2018-02-28 | Discharge: 2018-02-28 | Disposition: A | Discharge status: Home / Self Care | Payer: Medicaid Other | Source: Ambulatory Visit | Attending: Nurse Practitioner | Admitting: Nurse Practitioner

## 2018-02-28 ENCOUNTER — Other Ambulatory Visit (HOSPITAL_COMMUNITY): Payer: Self-pay | Admitting: *Deleted

## 2018-02-28 DIAGNOSIS — Z3687 Encounter for antenatal screening for uncertain dates: Secondary | ICD-10-CM | POA: Insufficient documentation

## 2018-02-28 DIAGNOSIS — O99213 Obesity complicating pregnancy, third trimester: Secondary | ICD-10-CM | POA: Diagnosis not present

## 2018-02-28 DIAGNOSIS — O0933 Supervision of pregnancy with insufficient antenatal care, third trimester: Secondary | ICD-10-CM | POA: Insufficient documentation

## 2018-02-28 DIAGNOSIS — Z3A34 34 weeks gestation of pregnancy: Secondary | ICD-10-CM | POA: Diagnosis not present

## 2018-02-28 DIAGNOSIS — Z362 Encounter for other antenatal screening follow-up: Secondary | ICD-10-CM | POA: Insufficient documentation

## 2018-03-06 ENCOUNTER — Other Ambulatory Visit (HOSPITAL_COMMUNITY)
Admission: RE | Admit: 2018-03-06 | Discharge: 2018-03-06 | Disposition: A | Discharge status: Home / Self Care | Payer: Medicaid Other | Source: Ambulatory Visit | Attending: Obstetrics & Gynecology | Admitting: Obstetrics & Gynecology

## 2018-03-06 ENCOUNTER — Ambulatory Visit (INDEPENDENT_AMBULATORY_CARE_PROVIDER_SITE_OTHER): Payer: Medicaid Other | Admitting: Obstetrics & Gynecology

## 2018-03-06 VITALS — BP 127/86 | HR 89 | Wt 297.6 lb

## 2018-03-06 DIAGNOSIS — Z3483 Encounter for supervision of other normal pregnancy, third trimester: Secondary | ICD-10-CM | POA: Diagnosis not present

## 2018-03-06 DIAGNOSIS — Z3480 Encounter for supervision of other normal pregnancy, unspecified trimester: Secondary | ICD-10-CM | POA: Diagnosis present

## 2018-03-06 DIAGNOSIS — O0933 Supervision of pregnancy with insufficient antenatal care, third trimester: Secondary | ICD-10-CM

## 2018-03-06 NOTE — Progress Notes (Signed)
   PRENATAL VISIT NOTE  Subjective:  Angel Hodges is a 27 y.o. G2P1001 at [redacted]w[redacted]d being seen today for ongoing prenatal care.  She is currently monitored for the following issues for this high-risk pregnancy and has Late prenatal care affecting pregnancy, antepartum, third trimester; Supervision of other normal pregnancy, antepartum; Morbid obesity (HCC); and Rubella non-immune status, antepartum on their problem list.  Patient reports no complaints.  Contractions: Not present. Vag. Bleeding: None.  Movement: Present. Denies leaking of fluid.   The following portions of the patient's history were reviewed and updated as appropriate: allergies, current medications, past family history, past medical history, past social history, past surgical history and problem list. Problem list updated.  Objective:   Vitals:   03/06/18 0918  BP: 127/86  Pulse: 89  Weight: 297 lb 9.6 oz (135 kg)    Fetal Status: Fetal Heart Rate (bpm): 152   Movement: Present     General:  Alert, oriented and cooperative. Patient is in no acute distress.  Skin: Skin is warm and dry. No rash noted.   Cardiovascular: Normal heart rate noted  Respiratory: Normal respiratory effort, no problems with respiration noted  Abdomen: Soft, gravid, appropriate for gestational age.  Pain/Pressure: Absent     Pelvic: Cervical exam deferred        Extremities: Normal range of motion.  Edema: None  Mental Status: Normal mood and affect. Normal behavior. Normal judgment and thought content.   Assessment and Plan:  Pregnancy: G2P1001 at [redacted]w[redacted]d  1. Supervision of other normal pregnancy, antepartum  - GC/Chlamydia probe amp ([place])not at [facility] - Culture, beta strep (group b only)  2. Morbid obesity (HCC) - MFM f/u u/s 03/21/18  3. Late prenatal care affecting pregnancy, antepartum, third trimester   Preterm labor symptoms and general obstetric precautions including but not limited to vaginal bleeding, contractions,  leaking of fluid and fetal movement were reviewed in detail with the patient. Please refer to After Visit Summary for other counseling recommendations.  Return in about 1 week (around 03/13/2018).  Future Appointments  Date Time Provider Department Center  03/21/2018 10:15 AM [facility] [place] 4 [facility]-M[facility]US M[facility]-US    Allie Bossier, MD

## 2018-03-07 LAB — GC/CHLAMYDIA PROBE AMP (~~LOC~~) NOT AT ARMC
Chlamydia: NEGATIVE
Neisseria Gonorrhea: NEGATIVE

## 2018-03-09 LAB — CULTURE, BETA STREP (GROUP B ONLY): Strep Gp B Culture: POSITIVE — AB

## 2018-03-13 ENCOUNTER — Encounter: Payer: Self-pay | Admitting: *Deleted

## 2018-03-13 ENCOUNTER — Ambulatory Visit (INDEPENDENT_AMBULATORY_CARE_PROVIDER_SITE_OTHER): Payer: Medicaid Other | Admitting: Nurse Practitioner

## 2018-03-13 VITALS — BP 134/89 | HR 77 | Wt 299.3 lb

## 2018-03-13 DIAGNOSIS — Z3480 Encounter for supervision of other normal pregnancy, unspecified trimester: Secondary | ICD-10-CM

## 2018-03-13 DIAGNOSIS — R030 Elevated blood-pressure reading, without diagnosis of hypertension: Secondary | ICD-10-CM

## 2018-03-13 DIAGNOSIS — Z3483 Encounter for supervision of other normal pregnancy, third trimester: Secondary | ICD-10-CM

## 2018-03-13 NOTE — Progress Notes (Signed)
    Subjective:  Angel Hodges is a 27 y.o. G2P1001 at [redacted]w[redacted]d being seen today for ongoing prenatal care.  She is currently monitored for the following issues for this low-risk pregnancy and has Late prenatal care affecting pregnancy, antepartum, third trimester; Supervision of other normal pregnancy, antepartum; Morbid obesity (HCC); and Rubella non-immune status, antepartum on their problem list.  Patient reports no complaints.  Contractions: Not present. Vag. Bleeding: None.  Movement: Present. Denies leaking of fluid.   The following portions of the patient's history were reviewed and updated as appropriate: allergies, current medications, past family history, past medical history, past social history, past surgical history and problem list. Problem list updated.  Objective:   Vitals:   03/13/18 0915  BP: 134/89  Pulse: 77  Weight: 299 lb 4.8 oz (135.8 kg)    Fetal Status: Fetal Heart Rate (bpm): 143 Fundal Height: 37 cm Movement: Present     General:  Alert, oriented and cooperative. Patient is in no acute distress.  Skin: Skin is warm and dry. No rash noted.   Cardiovascular: Normal heart rate noted  Respiratory: Normal respiratory effort, no problems with respiration noted  Abdomen: Soft, gravid, appropriate for gestational age. Pain/Pressure: Absent     Pelvic:  Cervical exam deferred        Extremities: Normal range of motion.  Edema: None  Mental Status: Normal mood and affect. Normal behavior. Normal judgment and thought content.   Urinalysis:      Assessment and Plan:  Pregnancy: G2P1001 at [redacted]w[redacted]d  1. Supervision of other normal pregnancy, antepartum Has not decided on a contraception method - is considering BTL and had her sign papers today although she may need an outpatient BTL if she decides on having the surgery.  2. Elevated BP without diagnosis of hypertension Will check urinalysis for protein No visual changes, no headaches, no RUQ Advised to return to MAU  if any of these occur or if edema is worsening.  Term labor symptoms and general obstetric precautions including but not limited to vaginal bleeding, contractions, leaking of fluid and fetal movement were reviewed in detail with the patient. Please refer to After Visit Summary for other counseling recommendations.  Return in about 1 week (around 03/20/2018).  Nolene Bernheim, RN, MSN, NP-BC Nurse Practitioner, [facility] for [employer], [facility] 03/13/2018 9:26 AM

## 2018-03-13 NOTE — Patient Instructions (Signed)
Contraception Choices Contraception, also called birth control, refers to methods or devices that prevent pregnancy. Hormonal methods Contraceptive implant A contraceptive implant is a thin, plastic tube that contains a hormone. It is inserted into the upper part of the arm. It can remain in place for up to 3 years. Progestin-only injections Progestin-only injections are injections of progestin, a synthetic form of the hormone progesterone. They are given every 3 months by a health care provider. Birth control pills Birth control pills are pills that contain hormones that prevent pregnancy. They must be taken once a day, preferably at the same time each day. Birth control patch The birth control patch contains hormones that prevent pregnancy. It is placed on the skin and must be changed once a week for three weeks and removed on the fourth week. A prescription is needed to use this method of contraception. Vaginal ring A vaginal ring contains hormones that prevent pregnancy. It is placed in the vagina for three weeks and removed on the fourth week. After that, the process is repeated with a new ring. A prescription is needed to use this method of contraception. Emergency contraceptive Emergency contraceptives prevent pregnancy after unprotected sex. They come in pill form and can be taken up to 5 days after sex. They work best the sooner they are taken after having sex. Most emergency contraceptives are available without a prescription. This method should not be used as your only form of birth control. Barrier methods Female condom A female condom is a thin sheath that is worn over the penis during sex. Condoms keep sperm from going inside a woman's body. They can be used with a spermicide to increase their effectiveness. They should be disposed after a single use. Female condom A female condom is a soft, loose-fitting sheath that is put into the vagina before sex. The condom keeps sperm from going  inside a woman's body. They should be disposed after a single use. Diaphragm A diaphragm is a soft, dome-shaped barrier. It is inserted into the vagina before sex, along with a spermicide. The diaphragm blocks sperm from entering the uterus, and the spermicide kills sperm. A diaphragm should be left in the vagina for 6-8 hours after sex and removed within 24 hours. A diaphragm is prescribed and fitted by a health care provider. A diaphragm should be replaced every 1-2 years, after giving birth, after gaining more than 15 lb (6.8 kg), and after pelvic surgery. Cervical cap A cervical cap is a round, soft latex or plastic cup that fits over the cervix. It is inserted into the vagina before sex, along with spermicide. It blocks sperm from entering the uterus. The cap should be left in place for 6-8 hours after sex and removed within 48 hours. A cervical cap must be prescribed and fitted by a health care provider. It should be replaced every 2 years. Sponge A sponge is a soft, circular piece of polyurethane foam with spermicide on it. The sponge helps block sperm from entering the uterus, and the spermicide kills sperm. To use it, you make it wet and then insert it into the vagina. It should be inserted before sex, left in for at least 6 hours after sex, and removed and thrown away within 30 hours. Spermicides Spermicides are chemicals that kill or block sperm from entering the cervix and uterus. They can come as a cream, jelly, suppository, foam, or tablet. A spermicide should be inserted into the vagina with an applicator at least 10-15 minutes before   sex to allow time for it to work. The process must be repeated every time you have sex. Spermicides do not require a prescription. Intrauterine contraception Intrauterine device (IUD) An IUD is a T-shaped device that is put in a woman's uterus. There are two types:  Hormone IUD.This type contains progestin, a synthetic form of the hormone progesterone. This  type can stay in place for 3-5 years.  Copper IUD.This type is wrapped in copper wire. It can stay in place for 10 years.  Permanent methods of contraception Female tubal ligation In this method, a woman's fallopian tubes are sealed, tied, or blocked during surgery to prevent eggs from traveling to the uterus. Hysteroscopic sterilization In this method, a small, flexible insert is placed into each fallopian tube. The inserts cause scar tissue to form in the fallopian tubes and block them, so sperm cannot reach an egg. The procedure takes about 3 months to be effective. Another form of birth control must be used during those 3 months. Female sterilization This is a procedure to tie off the tubes that carry sperm (vasectomy). After the procedure, the man can still ejaculate fluid (semen). Natural planning methods Natural family planning In this method, a couple does not have sex on days when the woman could become pregnant. Calendar method This means keeping track of the length of each menstrual cycle, identifying the days when pregnancy can happen, and not having sex on those days. Ovulation method In this method, a couple avoids sex during ovulation. Symptothermal method This method involves not having sex during ovulation. The woman typically checks for ovulation by watching changes in her temperature and in the consistency of cervical mucus. Post-ovulation method In this method, a couple waits to have sex until after ovulation. Summary  Contraception, also called birth control, means methods or devices that prevent pregnancy.  Hormonal methods of contraception include implants, injections, pills, patches, vaginal rings, and emergency contraceptives.  Barrier methods of contraception can include female condoms, female condoms, diaphragms, cervical caps, sponges, and spermicides.  There are two types of IUDs (intrauterine devices). An IUD can be put in a woman's uterus to prevent pregnancy  for 3-5 years.  Permanent sterilization can be done through a procedure for males, females, or both.  Natural family planning methods involve not having sex on days when the woman could become pregnant. This information is not intended to replace advice given to you by your health care provider. Make sure you discuss any questions you have with your health care provider. Document Released: 04/24/2005 Document Revised: 05/27/2016 Document Reviewed: 05/27/2016 Elsevier Interactive Patient Education  2018 Elsevier Inc.  

## 2018-03-14 ENCOUNTER — Telehealth: Payer: Self-pay | Admitting: *Deleted

## 2018-03-14 DIAGNOSIS — Z3480 Encounter for supervision of other normal pregnancy, unspecified trimester: Secondary | ICD-10-CM

## 2018-03-14 NOTE — Telephone Encounter (Addendum)
Received a voice mail from this am asking about test results from tests at last appointment- states just got results and has question.  I called Angel Hodges and she wanted to understand what +GBS was. I explained result to her and that she will be treated in labor to protect the baby. She voices understanding.

## 2018-03-15 ENCOUNTER — Encounter: Payer: Self-pay | Admitting: Obstetrics & Gynecology

## 2018-03-15 DIAGNOSIS — O99820 Streptococcus B carrier state complicating pregnancy: Secondary | ICD-10-CM | POA: Insufficient documentation

## 2018-03-20 ENCOUNTER — Ambulatory Visit (INDEPENDENT_AMBULATORY_CARE_PROVIDER_SITE_OTHER): Payer: Medicaid Other | Admitting: Nurse Practitioner

## 2018-03-20 VITALS — BP 132/78 | HR 84 | Wt 302.3 lb

## 2018-03-20 DIAGNOSIS — Z3480 Encounter for supervision of other normal pregnancy, unspecified trimester: Secondary | ICD-10-CM

## 2018-03-20 DIAGNOSIS — R030 Elevated blood-pressure reading, without diagnosis of hypertension: Secondary | ICD-10-CM | POA: Insufficient documentation

## 2018-03-20 DIAGNOSIS — Z3483 Encounter for supervision of other normal pregnancy, third trimester: Secondary | ICD-10-CM

## 2018-03-20 DIAGNOSIS — O133 Gestational [pregnancy-induced] hypertension without significant proteinuria, third trimester: Secondary | ICD-10-CM

## 2018-03-20 LAB — COMPREHENSIVE METABOLIC PANEL
A/G RATIO: 1.2 (ref 1.2–2.2)
ALT: 24 IU/L (ref 0–32)
AST: 20 IU/L (ref 0–40)
Albumin: 3.5 g/dL (ref 3.5–5.5)
Alkaline Phosphatase: 116 IU/L (ref 39–117)
BUN/Creatinine Ratio: 17 (ref 9–23)
BUN: 11 mg/dL (ref 6–20)
Bilirubin Total: <0.2 mg/dL (ref 0.0–1.2)
CALCIUM: 9.3 mg/dL (ref 8.7–10.2)
CHLORIDE: 104 mmol/L (ref 96–106)
CO2: 20 mmol/L (ref 20–29)
Creatinine, Ser: 0.64 mg/dL (ref 0.57–1.00)
GFR, EST AFRICAN AMERICAN: 143 mL/min/{1.73_m2} (ref >59)
GFR, EST NON AFRICAN AMERICAN: 124 mL/min/{1.73_m2} (ref >59)
GLOBULIN, TOTAL: 3 g/dL (ref 1.5–4.5)
Glucose: 85 mg/dL (ref 65–99)
POTASSIUM: 4.3 mmol/L (ref 3.5–5.2)
SODIUM: 138 mmol/L (ref 134–144)
TOTAL PROTEIN: 6.5 g/dL (ref 6.0–8.5)

## 2018-03-20 LAB — CBC
HEMOGLOBIN: 11.1 g/dL (ref 11.1–15.9)
Hematocrit: 33.8 % — ABNORMAL LOW (ref 34.0–46.6)
MCH: 25.3 pg — AB (ref 26.6–33.0)
MCHC: 32.8 g/dL (ref 31.5–35.7)
MCV: 77 fL — ABNORMAL LOW (ref 79–97)
PLATELETS: 240 10*3/uL (ref 150–450)
RBC: 4.38 x10E6/uL (ref 3.77–5.28)
RDW: 14.8 % (ref 12.3–15.4)
WBC: 10.9 10*3/uL — ABNORMAL HIGH (ref 3.4–10.8)

## 2018-03-20 NOTE — Progress Notes (Signed)
    Subjective:  Angel Hodges is a 27 y.o. G2P1001 at 4668w5d being seen today for ongoing prenatal care.  She is currently monitored for the following issues for this low-risk pregnancy and has Late prenatal care affecting pregnancy, antepartum, third trimester; Supervision of other normal pregnancy, antepartum; Morbid obesity (HCC); Rubella non-immune status, antepartum; and GBS (group B Streptococcus carrier), +RV culture, currently pregnant on their problem list.  Patient reports no complaints.  Contractions: Not present. Vag. Bleeding: None.  Movement: Present. Denies leaking of fluid.   The following portions of the patient's history were reviewed and updated as appropriate: allergies, current medications, past family history, past medical history, past social history, past surgical history and problem list. Problem list updated.  Objective:   Vitals:   03/20/18 0919 03/20/18 1320  BP: (!) 141/86 132/78  Pulse: 82 84  Weight: (!) 302 lb 4.8 oz (137.1 kg)     Fetal Status: Fetal Heart Rate (bpm): 138 Fundal Height: 38 cm Movement: Present     General:  Alert, oriented and cooperative. Patient is in no acute distress.  Skin: Skin is warm and dry. No rash noted.   Cardiovascular: Normal heart rate noted  Respiratory: Normal respiratory effort, no problems with respiration noted  Abdomen: Soft, gravid, appropriate for gestational age. Pain/Pressure: Absent     Pelvic:  Cervical exam deferred        Extremities: Normal range of motion.  Edema: None  Mental Status: Normal mood and affect. Normal behavior. Normal judgment and thought content.   Urinalysis:      Assessment and Plan:  Pregnancy: G2P1001 at 4168w5d  1.  Supervision of other normal pregnancy Doing well.  No contractions.  2. Elevated BP without diagnosis of hypertension Due to high BP prior to pregnancy, anticipated her BP would likely go up during the pregnancy.   Denies any headaches, visual changes, edema, or RUQ  pain.  Reviewed these as warning signs and advised to come to MAU if these occur. Baseline labs done today. - Protein / creatinine ratio, urine - Comprehensive metabolic panel - CBC  Consult with Dr. Vergie LivingPickens.  Will have client return this afternoon for repeal BP check.  If continues to be elevated, will go to MAU.  Called client and she will return for BP check. BP on recheck was normal. Will get NST and BPP tomorrow with follow up ultrasound that has already been scheduled.  Term labor symptoms and general obstetric precautions including but not limited to vaginal bleeding, contractions, leaking of fluid and fetal movement were reviewed in detail with the patient. Please refer to After Visit Summary for other counseling recommendations.  Return in about 1 week (around 03/27/2018).  Angel BernheimERRI Israella Hubert, RN, MSN, NP-BC Nurse Practitioner, [facility] [employer] [facility], [facility] 03/20/2018 1:24 PM

## 2018-03-21 ENCOUNTER — Ambulatory Visit (HOSPITAL_COMMUNITY)
Admission: RE | Admit: 2018-03-21 | Discharge: 2018-03-21 | Disposition: A | Discharge status: Home / Self Care | Payer: Medicaid Other | Source: Ambulatory Visit | Attending: Nurse Practitioner | Admitting: Nurse Practitioner

## 2018-03-21 ENCOUNTER — Encounter (HOSPITAL_COMMUNITY): Payer: Self-pay

## 2018-03-21 DIAGNOSIS — O0933 Supervision of pregnancy with insufficient antenatal care, third trimester: Secondary | ICD-10-CM | POA: Diagnosis not present

## 2018-03-21 DIAGNOSIS — O133 Gestational [pregnancy-induced] hypertension without significant proteinuria, third trimester: Secondary | ICD-10-CM

## 2018-03-21 DIAGNOSIS — Z362 Encounter for other antenatal screening follow-up: Secondary | ICD-10-CM | POA: Diagnosis not present

## 2018-03-21 DIAGNOSIS — O99213 Obesity complicating pregnancy, third trimester: Secondary | ICD-10-CM | POA: Diagnosis not present

## 2018-03-21 DIAGNOSIS — Z3A37 37 weeks gestation of pregnancy: Secondary | ICD-10-CM

## 2018-03-21 LAB — PROTEIN / CREATININE RATIO, URINE
CREATININE, UR: 194.2 mg/dL
Protein, Ur: 27 mg/dL
Protein/Creat Ratio: 139 mg/g creat (ref 0–200)

## 2018-03-27 ENCOUNTER — Ambulatory Visit (INDEPENDENT_AMBULATORY_CARE_PROVIDER_SITE_OTHER): Payer: Medicaid Other | Admitting: Nurse Practitioner

## 2018-03-27 VITALS — BP 131/73 | HR 90 | Wt 303.0 lb

## 2018-03-27 DIAGNOSIS — Z3A38 38 weeks gestation of pregnancy: Secondary | ICD-10-CM

## 2018-03-27 DIAGNOSIS — Z3480 Encounter for supervision of other normal pregnancy, unspecified trimester: Secondary | ICD-10-CM

## 2018-03-27 DIAGNOSIS — Z3483 Encounter for supervision of other normal pregnancy, third trimester: Secondary | ICD-10-CM

## 2018-03-27 NOTE — Progress Notes (Signed)
    Subjective:  Angel Hodges is a 27 y.o. G2P1001 at 3629w5d being seen today for ongoing prenatal care.  She is currently monitored for the following issues for this low-risk pregnancy and has Late prenatal care affecting pregnancy, antepartum, third trimester; Supervision of other normal pregnancy, antepartum; Morbid obesity (HCC); Rubella non-immune status, antepartum; GBS (group B Streptococcus carrier), +RV culture, currently pregnant; and Elevated BP without diagnosis of hypertension on their problem list.  Patient reports no complaints.  Contractions: Not present. Vag. Bleeding: None.  Movement: Present. Denies leaking of fluid.  Still has low midline pubic symphysis pain when getting up in the mornings.  The following portions of the patient's history were reviewed and updated as appropriate: allergies, current medications, past family history, past medical history, past social history, past surgical history and problem list. Problem list updated.  Objective:   Vitals:   03/27/18 0952  BP: 131/73  Pulse: 90  Weight: (!) 303 lb (137.4 kg)    Fetal Status: Fetal Heart Rate (bpm): 130 Fundal Height: 39 cm Movement: Present     General:  Alert, oriented and cooperative. Patient is in no acute distress.  Skin: Skin is warm and dry. No rash noted.   Cardiovascular: Normal heart rate noted  Respiratory: Normal respiratory effort, no problems with respiration noted  Abdomen: Soft, gravid, appropriate for gestational age. Pain/Pressure: Absent     Pelvic:  Cervical exam deferred        Extremities: Normal range of motion.  Edema: None  Mental Status: Normal mood and affect. Normal behavior. Normal judgment and thought content.   Urinalysis:      Assessment and Plan:  Pregnancy: G2P1001 at 9229w5d  1. Supervision of other normal pregnancy, antepartum BP normal today - denies any symptoms of headache, blurred vision, RUQ pain or edema.  Has stopped work.  Term labor symptoms and  general obstetric precautions including but not limited to vaginal bleeding, contractions, leaking of fluid and fetal movement were reviewed in detail with the patient. Please refer to After Visit Summary for other counseling recommendations.  Return in about 1 week (around 04/03/2018).  Nolene BernheimERRI BURLESON, RN, MSN, NP-BC Nurse Practitioner, [facility] [employer], [facility] 03/27/2018 10:02 AM

## 2018-04-03 ENCOUNTER — Encounter: Payer: Self-pay | Admitting: Obstetrics and Gynecology

## 2018-04-03 ENCOUNTER — Ambulatory Visit (INDEPENDENT_AMBULATORY_CARE_PROVIDER_SITE_OTHER): Payer: Medicaid Other | Admitting: Obstetrics and Gynecology

## 2018-04-03 DIAGNOSIS — Z3480 Encounter for supervision of other normal pregnancy, unspecified trimester: Secondary | ICD-10-CM

## 2018-04-03 DIAGNOSIS — Z3483 Encounter for supervision of other normal pregnancy, third trimester: Secondary | ICD-10-CM

## 2018-04-03 DIAGNOSIS — Z3A39 39 weeks gestation of pregnancy: Secondary | ICD-10-CM

## 2018-04-03 NOTE — Progress Notes (Signed)
   PRENATAL VISIT NOTE  Subjective:  Angel Hodges is a 27 y.o. G2P1001 at 1716w5d being seen today for ongoing prenatal care.  She is currently monitored for the following issues for this low-risk pregnancy and has Late prenatal care affecting pregnancy, antepartum, third trimester; Supervision of other normal pregnancy, antepartum; Morbid obesity (HCC); Rubella non-immune status, antepartum; GBS (group B Streptococcus carrier), +RV culture, currently pregnant; and Elevated BP without diagnosis of hypertension on their problem list.  Patient reports no complaints.  Contractions: Not present. Vag. Bleeding: None.  Movement: Present. Denies leaking of fluid.   The following portions of the patient's history were reviewed and updated as appropriate: allergies, current medications, past family history, past medical history, past social history, past surgical history and problem list. Problem list updated.  Objective:   Vitals:   04/03/18 0840  BP: 132/83  Pulse: 97  Weight: (!) 307 lb 3.2 oz (139.3 kg)    Fetal Status: Fetal Heart Rate (bpm): 137 Fundal Height: 39 cm Movement: Present     General:  Alert, oriented and cooperative. Patient is in no acute distress.  Skin: Skin is warm and dry. No rash noted.   Cardiovascular: Normal heart rate noted  Respiratory: Normal respiratory effort, no problems with respiration noted  Abdomen: Soft, gravid, appropriate for gestational age.  Pain/Pressure: Absent     Pelvic: Cervical exam deferred        Extremities: Normal range of motion.     Mental Status: Normal mood and affect. Normal behavior. Normal judgment and thought content.   Assessment and Plan:  Pregnancy: G2P1001 at 7416w5d  1. Supervision of other normal pregnancy, antepartum  - Induction ordered placed, induction scheduled for 12/13 at midnight - 40 week visit next week with BPP and NST. Cervical exam then to determine induction course; cytotec vs. Foley bulb placement.  - GBS  positive, patient is aware.   Term labor symptoms and general obstetric precautions including but not limited to vaginal bleeding, contractions, leaking of fluid and fetal movement were reviewed in detail with the patient. Please refer to After Visit Summary for other counseling recommendations.  Return in about 1 week (around 04/10/2018) for Schedule next visit for BPP and NST please.  Future Appointments  Date Time Provider Department Center  04/10/2018  3:15 PM Allie Bossierove, Myra C, MD WOC-WOCA WOC  04/19/2018 12:00 AM WH-BSSCHED ROOM WH-BSSCHED None    Venia CarbonJennifer Tondalaya Perren, NP

## 2018-04-10 ENCOUNTER — Ambulatory Visit (INDEPENDENT_AMBULATORY_CARE_PROVIDER_SITE_OTHER): Payer: Medicaid Other | Admitting: General Practice

## 2018-04-10 ENCOUNTER — Inpatient Hospital Stay (HOSPITAL_COMMUNITY)
Admission: AD | Admit: 2018-04-10 | Discharge: 2018-04-12 | DRG: 807 | Disposition: A | Discharge status: Home / Self Care | Payer: Medicaid Other | Attending: Obstetrics & Gynecology | Admitting: Obstetrics & Gynecology

## 2018-04-10 ENCOUNTER — Encounter (HOSPITAL_COMMUNITY): Payer: Self-pay | Admitting: *Deleted

## 2018-04-10 ENCOUNTER — Ambulatory Visit (INDEPENDENT_AMBULATORY_CARE_PROVIDER_SITE_OTHER): Payer: Medicaid Other | Admitting: Obstetrics & Gynecology

## 2018-04-10 VITALS — BP 150/77 | HR 81 | Wt 307.0 lb

## 2018-04-10 DIAGNOSIS — Z3483 Encounter for supervision of other normal pregnancy, third trimester: Secondary | ICD-10-CM

## 2018-04-10 DIAGNOSIS — O480 Post-term pregnancy: Secondary | ICD-10-CM | POA: Diagnosis present

## 2018-04-10 DIAGNOSIS — O0933 Supervision of pregnancy with insufficient antenatal care, third trimester: Secondary | ICD-10-CM

## 2018-04-10 DIAGNOSIS — O99820 Streptococcus B carrier state complicating pregnancy: Secondary | ICD-10-CM

## 2018-04-10 DIAGNOSIS — R030 Elevated blood-pressure reading, without diagnosis of hypertension: Secondary | ICD-10-CM

## 2018-04-10 DIAGNOSIS — O9989 Other specified diseases and conditions complicating pregnancy, childbirth and the puerperium: Secondary | ICD-10-CM

## 2018-04-10 DIAGNOSIS — Z3A40 40 weeks gestation of pregnancy: Secondary | ICD-10-CM

## 2018-04-10 DIAGNOSIS — O99824 Streptococcus B carrier state complicating childbirth: Secondary | ICD-10-CM | POA: Diagnosis present

## 2018-04-10 DIAGNOSIS — O134 Gestational [pregnancy-induced] hypertension without significant proteinuria, complicating childbirth: Secondary | ICD-10-CM | POA: Diagnosis present

## 2018-04-10 DIAGNOSIS — Z3480 Encounter for supervision of other normal pregnancy, unspecified trimester: Secondary | ICD-10-CM

## 2018-04-10 DIAGNOSIS — O99214 Obesity complicating childbirth: Secondary | ICD-10-CM | POA: Diagnosis present

## 2018-04-10 DIAGNOSIS — Z283 Underimmunization status: Secondary | ICD-10-CM

## 2018-04-10 DIAGNOSIS — O09899 Supervision of other high risk pregnancies, unspecified trimester: Secondary | ICD-10-CM

## 2018-04-10 LAB — COMPREHENSIVE METABOLIC PANEL
ALT: 18 U/L (ref 0–44)
AST: 20 U/L (ref 15–41)
Albumin: 3.1 g/dL — ABNORMAL LOW (ref 3.5–5.0)
Alkaline Phosphatase: 129 U/L — ABNORMAL HIGH (ref 38–126)
Anion gap: 11 (ref 5–15)
BUN: 13 mg/dL (ref 6–20)
CO2: 20 mmol/L — ABNORMAL LOW (ref 22–32)
Calcium: 9.2 mg/dL (ref 8.9–10.3)
Chloride: 104 mmol/L (ref 98–111)
Creatinine, Ser: 0.63 mg/dL (ref 0.44–1.00)
GFR calc Af Amer: >60 mL/min (ref >60)
GFR calc non Af Amer: >60 mL/min (ref >60)
Glucose, Bld: 71 mg/dL (ref 70–99)
POTASSIUM: 4.3 mmol/L (ref 3.5–5.1)
Sodium: 135 mmol/L (ref 135–145)
Total Bilirubin: 0.3 mg/dL (ref 0.3–1.2)
Total Protein: 7.2 g/dL (ref 6.5–8.1)

## 2018-04-10 LAB — PROTEIN / CREATININE RATIO, URINE
CREATININE, URINE: 86 mg/dL
Protein Creatinine Ratio: 0.34 mg/mg{Cre} — ABNORMAL HIGH (ref 0.00–0.15)
TOTAL PROTEIN, URINE: 29 mg/dL

## 2018-04-10 LAB — TYPE AND SCREEN
ABO/RH(D): O POS
Antibody Screen: NEGATIVE

## 2018-04-10 LAB — CBC
HCT: 37 % (ref 36.0–46.0)
Hemoglobin: 11.8 g/dL — ABNORMAL LOW (ref 12.0–15.0)
MCH: 25.1 pg — ABNORMAL LOW (ref 26.0–34.0)
MCHC: 31.9 g/dL (ref 30.0–36.0)
MCV: 78.7 fL — AB (ref 80.0–100.0)
Platelets: 277 10*3/uL (ref 150–400)
RBC: 4.7 MIL/uL (ref 3.87–5.11)
RDW: 15.9 % — ABNORMAL HIGH (ref 11.5–15.5)
WBC: 15.7 10*3/uL — ABNORMAL HIGH (ref 4.0–10.5)
nRBC: 0 % (ref 0.0–0.2)

## 2018-04-10 LAB — ABO/RH: ABO/RH(D): O POS

## 2018-04-10 MED ORDER — OXYCODONE-ACETAMINOPHEN 5-325 MG PO TABS: 1.0000 | ORAL_TABLET | ORAL | Status: DC | PRN

## 2018-04-10 MED ORDER — FENTANYL CITRATE (PF) 100 MCG/2ML IJ SOLN: 100.0000 ug | INTRAMUSCULAR | Status: DC | PRN

## 2018-04-10 MED ORDER — MISOPROSTOL 50MCG HALF TABLET: 50.0000 ug | ORAL_TABLET | Freq: Once | ORAL | Status: DC

## 2018-04-10 MED ORDER — TERBUTALINE SULFATE 1 MG/ML IJ SOLN
0.2500 mg | Freq: Once | INTRAMUSCULAR | Status: DC | PRN
  Filled 2018-04-10: qty 1

## 2018-04-10 MED ORDER — OXYTOCIN 40 UNITS IN LACTATED RINGERS INFUSION - SIMPLE MED
1.0000 m[IU]/min | INTRAVENOUS | Status: DC
  Administered 2018-04-10: 2 m[IU]/min via INTRAVENOUS
  Administered 2018-04-11: 4 m[IU]/min via INTRAVENOUS

## 2018-04-10 MED ORDER — OXYCODONE-ACETAMINOPHEN 5-325 MG PO TABS: 2.0000 | ORAL_TABLET | ORAL | Status: DC | PRN

## 2018-04-10 MED ORDER — HYDRALAZINE HCL 20 MG/ML IJ SOLN: 10.0000 mg | INTRAMUSCULAR | Status: DC | PRN

## 2018-04-10 MED ORDER — LABETALOL HCL 5 MG/ML IV SOLN: 80.0000 mg | INTRAVENOUS | Status: DC | PRN

## 2018-04-10 MED ORDER — LABETALOL HCL 5 MG/ML IV SOLN: 20.0000 mg | INTRAVENOUS | Status: DC | PRN

## 2018-04-10 MED ORDER — ONDANSETRON HCL 4 MG/2ML IJ SOLN: 4.0000 mg | Freq: Four times a day (QID) | INTRAMUSCULAR | Status: DC | PRN

## 2018-04-10 MED ORDER — MISOPROSTOL 50MCG HALF TABLET
50.0000 ug | ORAL_TABLET | ORAL | Status: DC | PRN
  Administered 2018-04-10: 50 ug via BUCCAL
  Filled 2018-04-10 (×2): qty 1

## 2018-04-10 MED ORDER — LACTATED RINGERS IV SOLN
INTRAVENOUS | Status: DC
  Administered 2018-04-10: 17:00:00 via INTRAVENOUS

## 2018-04-10 MED ORDER — ACETAMINOPHEN 325 MG PO TABS
650.0000 mg | ORAL_TABLET | ORAL | Status: DC | PRN
  Filled 2018-04-10: qty 2

## 2018-04-10 MED ORDER — LABETALOL HCL 5 MG/ML IV SOLN: 40.0000 mg | INTRAVENOUS | Status: DC | PRN

## 2018-04-10 MED ORDER — OXYTOCIN BOLUS FROM INFUSION
500.0000 mL | Freq: Once | INTRAVENOUS | Status: AC
  Administered 2018-04-11: 500 mL via INTRAVENOUS

## 2018-04-10 MED ORDER — SODIUM CHLORIDE 0.9 % IV SOLN
5.0000 10*6.[IU] | Freq: Once | INTRAVENOUS | Status: AC
  Administered 2018-04-10: 5 10*6.[IU] via INTRAVENOUS
  Filled 2018-04-10: qty 5

## 2018-04-10 MED ORDER — MISOPROSTOL 50MCG HALF TABLET: 50.0000 ug | ORAL_TABLET | ORAL | Status: DC | PRN

## 2018-04-10 MED ORDER — SOD CITRATE-CITRIC ACID 500-334 MG/5ML PO SOLN: 30.0000 mL | ORAL | Status: DC | PRN

## 2018-04-10 MED ORDER — LACTATED RINGERS IV SOLN
500.0000 mL | INTRAVENOUS | Status: DC | PRN
  Administered 2018-04-10: 500 mL via INTRAVENOUS

## 2018-04-10 MED ORDER — OXYTOCIN 40 UNITS IN LACTATED RINGERS INFUSION - SIMPLE MED
2.5000 [IU]/h | INTRAVENOUS | Status: DC
  Administered 2018-04-11: 2.5 [IU]/h via INTRAVENOUS
  Filled 2018-04-10: qty 1000

## 2018-04-10 MED ORDER — LIDOCAINE HCL (PF) 1 % IJ SOLN
30.0000 mL | INTRAMUSCULAR | Status: DC | PRN
  Filled 2018-04-10: qty 30

## 2018-04-10 MED ORDER — PENICILLIN G 3 MILLION UNITS IVPB - SIMPLE MED
3.0000 10*6.[IU] | INTRAVENOUS | Status: DC
  Administered 2018-04-10 – 2018-04-11 (×2): 3 10*6.[IU] via INTRAVENOUS
  Filled 2018-04-10 (×4): qty 100

## 2018-04-10 NOTE — Progress Notes (Signed)
LABOR PROGRESS NOTE  Angel Hodges is a 27 y.o. female 543P2002 with IUP at 1876w5d presenting for IOL in the setting of elevated blood pressure post-dates.   Subjective: Patient resting comfortably without contractions. Agrees to foley balloon placement.   Objective: BP (!) 123/98   Pulse (!) 105   Ht 5\' 7"  (1.702 m)   BMI 48.08 kg/m  or  Vitals:   04/10/18 1632 04/10/18 1659  BP: (!) 123/98   Pulse: (!) 105   Height:  5\' 7"  (1.702 m)    Dilation: 2 Effacement (%): 70 Station: Ballotable Presentation: Vertex Exam by:: wallace FHT: baseline rate 140s, moderate variability, +acel, -decel Toco: None present  Labs: Lab Results  Component Value Date   WBC 15.7 (H) 04/10/2018   HGB 11.8 (L) 04/10/2018   HCT 37.0 04/10/2018   MCV 78.7 (L) 04/10/2018   PLT 277 04/10/2018    Patient Active Problem List   Diagnosis Date Noted  . Elevated BP without diagnosis of hypertension 03/20/2018  . GBS (group B Streptococcus carrier), +RV culture, currently pregnant 03/15/2018  . Rubella non-immune status, antepartum 01/17/2018  . Supervision of other normal pregnancy, antepartum 01/08/2018  . Morbid obesity (HCC) 01/08/2018  . Late prenatal care affecting pregnancy, antepartum, third trimester 01/07/2018    Assessment / Plan: Angel Hodges is a 27 y.o. female 443P2002 with IUP at 6076w5d presenting for IOL in the setting of elevated blood pressure post-dates.   Labor: Not in active labor. Foley placement at this check with order for 50 mcg buccal misoprostol Fetal Wellbeing: Reactive, reassuring FHT. Pain Control:  None required at this time. Anticipated MOD:  Vaginal  Wendie AgresteSarah Asman Leylani Duley, MD PGY-1 Family Medicine Resident 04/10/2018, 6:18 PM

## 2018-04-10 NOTE — Progress Notes (Signed)
Patient reports headache since this morning, hasn't taken anything for it. Denies dizziness or blurry vision.

## 2018-04-10 NOTE — H&P (Addendum)
OBSTETRIC ADMISSION HISTORY AND PHYSICAL  Angel Hodges is a 27 y.o. female G61P2002 with IUP at 71w5dpresenting for IOL in the setting of elevated blood pressure post-dates. Had an elevated blood pressure associated with headache today in clinic with a history of a prior elevated BP in August and [facility]  She reports +FMs. No LOF, VB, blurry vision, headaches, peripheral edema, or RUQ pain. She plans on formula feeding.   She is uncertain about birth control. Does have BTL papers (signed papers 11/6, will mature 12/6), but has decided against since then. Not interested in LARCs. Interested in patch vs pills.  Dating: By 14w [place]--->  Estimated Date of Delivery: 04/05/18  Sono:   '@[redacted]w[redacted]d'$ , CWD, normal anatomy, cephalic presentation, 15093g, 40%ile   [facility]Complications[facility]   Past Medical History: Past Medical History:  Diagnosis Date  . Keloid   . Medical history non-contributory     Past Surgical History: Past Surgical History:  Procedure Laterality Date  . NO PAST SURGERIES      Obstetrical History: OB History    Gravida  3   Para  2   Term  2   Preterm  0   AB  0   Living  2     SAB  0   TAB  0   Ectopic  0   Multiple  0   Live Births  2           Social History: Social History   Socioeconomic History  . Marital status: Single    Spouse name: Not on file  . Number of children: Not on file  . Years of education: Not on file  . Highest education level: Not on file  Occupational History  . Not on file  Social Needs  . Financial resource strain: Not on file  . Food insecurity:    Worry: Not on file    Inability: Not on file  . Transportation needs:    Medical: Not on file    Non-medical: Not on file  Tobacco Use  . Smoking status: Never Smoker  . Smokeless tobacco: Never Used  Substance and Sexual Activity  . Alcohol use: No  . Drug use: No  . Sexual activity: Yes    Birth  control/protection: None  Lifestyle  . Physical activity:    Days per week: Not on file    Minutes per session: Not on file  . Stress: Not on file  Relationships  . Social connections:    Talks on phone: Not on file    Gets together: Not on file    Attends religious service: Not on file    Active member of club or organization: Not on file    Attends meetings of clubs or organizations: Not on file    Relationship status: Not on file  Other Topics Concern  . Not on file  Social History Narrative  . Not on file    Family History: History reviewed. No pertinent family history.  Allergies: No Known Allergies  Medications Prior to Admission  Medication Sig Dispense Refill Last Dose  . Elastic Bandages & Supports (COMFORT FIT MATERNITY SUPP MED) MISC 1 Device by Does not apply route daily. 1 each 0 Taking  . Prenatal Vit-Fe Fumarate-FA (PRENATAL VITAMIN PO) Take by mouth.   Taking     Review of Systems  All systems reviewed and negative except as stated in HPI  Blood pressure (!) 123/98, pulse (!) 105, height '5\' 7"'$  (1.702 m). General appearance: alert, cooperative and no distress Lungs: regular rate and effort Heart: regular rate  Abdomen: soft, non-tender Extremities: Homans sign is negative, no sign of DVT Presentation: cephalic by vaginal exam and external exam Fetal monitoringBaseline: 140s bpm, Variability: Good {> 6 bpm), Accelerations: Reactive and Decelerations: Absent Uterine activity: No cx Dilation: 2 Effacement (%): 70 Station: Ballotable Exam by:: Zacharius Funari Prenatal labs: ABO, Rh: O/Positive/-- (09/03 0933) Antibody: Negative (09/03 0933) Rubella: <0.90 (09/03 0933) RPR: Non Reactive (09/03 0933)  HBsAg: Negative (09/03 0933)  HIV: Non Reactive (09/03 0933)  GBS:  Positive  2 hr GTT - Early third tri, normal  Prenatal Transfer Tool  Maternal Diabetes: No Genetic Screening: Normal Maternal Ultrasounds/Referrals: Normal Fetal Ultrasounds or other  Referrals:  None Maternal Substance Abuse:  No Significant Maternal Medications:  None Significant Maternal Lab Results: None  Results for orders placed or performed during the hospital encounter of 04/10/18 (from the past 24 hour(s))  CBC   Collection Time: 04/10/18  4:50 PM  Result Value Ref Range   WBC 15.7 (H) 4.0 - 10.5 K/uL   RBC 4.70 3.87 - 5.11 MIL/uL   Hemoglobin 11.8 (L) 12.0 - 15.0 g/dL   HCT 37.0 36.0 - 46.0 %   MCV 78.7 (L) 80.0 - 100.0 fL   MCH 25.1 (L) 26.0 - 34.0 pg   MCHC 31.9 30.0 - 36.0 g/dL   RDW 15.9 (H) 11.5 - 15.5 %   Platelets 277 150 - 400 K/uL   nRBC 0.0 0.0 - 0.2 %    Patient Active Problem List   Diagnosis Date Noted  . Elevated BP without diagnosis of hypertension 03/20/2018  . GBS (group B Streptococcus carrier), +RV culture, currently pregnant 03/15/2018  . Rubella non-immune status, antepartum 01/17/2018  . Supervision of other normal pregnancy, antepartum 01/08/2018  . Morbid obesity ([place]) 01/08/2018  . Late prenatal care affecting pregnancy, antepartum, third trimester 01/07/2018    Assessment: Angel Hodges is a 27 y.o. G3P2002 at 72w5dhere for IOL in the setting of elevated blood pressure post-dates.  1. Labor: No SOL 2. FWB: Reactive, reassuring FHT 3. Pain: Not present. 4. GBS: Positive   Plan:  1. Labor: Initiate IOL with foley placement and misoprostol 50 mcg buccal 2. FWB: Continue to monitor. 3. Pain: Epidural and IV pain medication PRN. 4. GBS: Initiate PCN. 5. Elevated Blood Pressure: Will check CMP, CBC, UPC and monitor for severe range features. Currently asymptomatic.   After delivery, patient will require MMR and PP BTL.  Attestation: I have seen this patient and agree with the resident's documentation. I have examined them separately, and we have discussed the plan of care.  LLambert Mody WJuleen China DO OB/GYN Fellow

## 2018-04-10 NOTE — Progress Notes (Signed)
   PRENATAL VISIT NOTE  Subjective:  Angel Hodges is a 27 y.o. G3P2002 at 4922w5d being seen today for ongoing prenatal care.  She is currently monitored for the following issues for this high-risk pregnancy and has Late prenatal care affecting pregnancy, antepartum, third trimester; Supervision of other normal pregnancy, antepartum; Morbid obesity (HCC); Rubella non-immune status, antepartum; GBS (group B Streptococcus carrier), +RV culture, currently pregnant; and Elevated BP without diagnosis of hypertension on their problem list.  Patient reports headache.  Contractions: Not present. Vag. Bleeding: None.  Movement: Present. Denies leaking of fluid.   The following portions of the patient's history were reviewed and updated as appropriate: allergies, current medications, past family history, past medical history, past social history, past surgical history and problem list. Problem list updated.  Objective:   Vitals:   04/10/18 1448 04/10/18 1453  BP: (!) 152/80 (!) 150/77  Pulse: 81   Weight: (!) 307 lb (139.3 kg)     Fetal Status:     Movement: Present     General:  Alert, oriented and cooperative. Patient is in no acute distress.  Skin: Skin is warm and dry. No rash noted.   Cardiovascular: Normal heart rate noted  Respiratory: Normal respiratory effort, no problems with respiration noted  Abdomen: Soft, gravid, appropriate for gestational age.  Pain/Pressure: Present     Pelvic: Cervical exam performed        Extremities: Normal range of motion.  Edema: None  Mental Status: Normal mood and affect. Normal behavior. Normal judgment and thought content.   Assessment and Plan:  Pregnancy: G3P2002 at 5422w5d  1. Late prenatal care affecting pregnancy, antepartum, third trimester  2. Supervision of other normal pregnancy, antepartum   3. Elevated BP without diagnosis of hypertension - To L&D for IOL  4. GBS (group B Streptococcus carrier), +RV culture, currently  pregnant   5. Morbid obesity (HCC)   Term labor symptoms and general obstetric precautions including but not limited to vaginal bleeding, contractions, leaking of fluid and fetal movement were reviewed in detail with the patient. Please refer to After Visit Summary for other counseling recommendations.  No follow-ups on file.  Future Appointments  Date Time Provider Department Center  04/10/2018  3:15 PM Allie Bossierove, Han Lysne C, MD [facility]  04/19/2018 12:00 AM [facility] ROOM [facility] None    Allie BossierMyra C Tauren Delbuono, MD

## 2018-04-10 NOTE — Progress Notes (Signed)
Patient presents to office today for NST & OB F/u. Patient had two elevated systolic BPs and was taken to room to see MD. Chase Callerarrie H RN BSN 04/10/18

## 2018-04-10 NOTE — Anesthesia Pain Management Evaluation Note (Signed)
  CRNA Pain Management Visit Note  Patient: Angel Hodges, 27 y.o., female  "Hello [facility] am a [facility]. We have an anesthesia team available at all times to provide care throughout the hospital, including epidural management and anesthesia for C-section. I don't know your plan for the delivery whether it a natural birth, water birth, IV sedation, nitrous supplementation, doula or epidural, but we want to meet your pain goals."   1.Was your pain managed to your expectations on prior hospitalizations?   Yes   2.What is your expectation for pain management during this hospitalization?     Epidural, IV pain meds and Nitrous Oxide  3.How can we help you reach that goal? Be available  Record the patient's initial score and the patient's pain goal.   Pain: 2  Pain Goal: 6 [facility] wants you to be able to say your pain was always managed very well.  [facility] CenterMERRITT,Meshia Rau 04/10/2018

## 2018-04-11 ENCOUNTER — Inpatient Hospital Stay (HOSPITAL_COMMUNITY): Payer: Medicaid Other | Admitting: Anesthesiology

## 2018-04-11 ENCOUNTER — Encounter (HOSPITAL_COMMUNITY): Payer: Self-pay | Admitting: *Deleted

## 2018-04-11 DIAGNOSIS — O99824 Streptococcus B carrier state complicating childbirth: Secondary | ICD-10-CM

## 2018-04-11 DIAGNOSIS — O480 Post-term pregnancy: Secondary | ICD-10-CM

## 2018-04-11 DIAGNOSIS — Z3A40 40 weeks gestation of pregnancy: Secondary | ICD-10-CM

## 2018-04-11 LAB — CBC
HCT: 33.9 % — ABNORMAL LOW (ref 36.0–46.0)
Hemoglobin: 10.9 g/dL — ABNORMAL LOW (ref 12.0–15.0)
MCH: 24.9 pg — ABNORMAL LOW (ref 26.0–34.0)
MCHC: 32.2 g/dL (ref 30.0–36.0)
MCV: 77.4 fL — ABNORMAL LOW (ref 80.0–100.0)
Platelets: 252 10*3/uL (ref 150–400)
RBC: 4.38 MIL/uL (ref 3.87–5.11)
RDW: 15.9 % — ABNORMAL HIGH (ref 11.5–15.5)
WBC: 21.2 10*3/uL — ABNORMAL HIGH (ref 4.0–10.5)
nRBC: 0 % (ref 0.0–0.2)

## 2018-04-11 LAB — RPR: RPR Ser Ql: NONREACTIVE

## 2018-04-11 MED ORDER — DIPHENHYDRAMINE HCL 25 MG PO CAPS: 25.0000 mg | ORAL_CAPSULE | Freq: Four times a day (QID) | ORAL | Status: DC | PRN

## 2018-04-11 MED ORDER — EPHEDRINE 5 MG/ML INJ
10.0000 mg | INTRAVENOUS | Status: DC | PRN
  Filled 2018-04-11: qty 2

## 2018-04-11 MED ORDER — LIDOCAINE HCL (PF) 1 % IJ SOLN
INTRAMUSCULAR | Status: DC | PRN
  Administered 2018-04-11 (×2): 5 mL via EPIDURAL

## 2018-04-11 MED ORDER — COCONUT OIL OIL: 1.0000 "application " | TOPICAL_OIL | Status: DC | PRN

## 2018-04-11 MED ORDER — TETANUS-DIPHTH-ACELL PERTUSSIS 5-2.5-18.5 LF-MCG/0.5 IM SUSP: 0.5000 mL | Freq: Once | INTRAMUSCULAR | Status: DC

## 2018-04-11 MED ORDER — DIPHENHYDRAMINE HCL 50 MG/ML IJ SOLN: 12.5000 mg | INTRAMUSCULAR | Status: DC | PRN

## 2018-04-11 MED ORDER — IBUPROFEN 600 MG PO TABS
600.0000 mg | ORAL_TABLET | Freq: Four times a day (QID) | ORAL | Status: DC
  Administered 2018-04-11 – 2018-04-12 (×6): 600 mg via ORAL
  Filled 2018-04-11 (×6): qty 1

## 2018-04-11 MED ORDER — WITCH HAZEL-GLYCERIN EX PADS: 1.0000 "application " | MEDICATED_PAD | CUTANEOUS | Status: DC | PRN

## 2018-04-11 MED ORDER — ONDANSETRON HCL 4 MG PO TABS: 4.0000 mg | ORAL_TABLET | ORAL | Status: DC | PRN

## 2018-04-11 MED ORDER — PHENYLEPHRINE 40 MCG/ML (10ML) SYRINGE FOR IV PUSH (FOR BLOOD PRESSURE SUPPORT)
80.0000 ug | PREFILLED_SYRINGE | INTRAVENOUS | Status: DC | PRN
  Filled 2018-04-11: qty 5

## 2018-04-11 MED ORDER — BENZOCAINE-MENTHOL 20-0.5 % EX AERO
1.0000 "application " | INHALATION_SPRAY | CUTANEOUS | Status: DC | PRN
  Administered 2018-04-11: 1 via TOPICAL
  Filled 2018-04-11: qty 56

## 2018-04-11 MED ORDER — PRENATAL MULTIVITAMIN CH
1.0000 | ORAL_TABLET | Freq: Every day | ORAL | Status: DC
  Administered 2018-04-11 – 2018-04-12 (×2): 1 via ORAL
  Filled 2018-04-11 (×2): qty 1

## 2018-04-11 MED ORDER — LACTATED RINGERS IV SOLN
500.0000 mL | Freq: Once | INTRAVENOUS | Status: AC
  Administered 2018-04-11: 500 mL via INTRAVENOUS

## 2018-04-11 MED ORDER — ZOLPIDEM TARTRATE 5 MG PO TABS: 5.0000 mg | ORAL_TABLET | Freq: Every evening | ORAL | Status: DC | PRN

## 2018-04-11 MED ORDER — ACETAMINOPHEN 325 MG PO TABS
650.0000 mg | ORAL_TABLET | ORAL | Status: DC | PRN
  Administered 2018-04-11: 650 mg via ORAL

## 2018-04-11 MED ORDER — PHENYLEPHRINE 40 MCG/ML (10ML) SYRINGE FOR IV PUSH (FOR BLOOD PRESSURE SUPPORT)
80.0000 ug | PREFILLED_SYRINGE | INTRAVENOUS | Status: DC | PRN
  Filled 2018-04-11: qty 5
  Filled 2018-04-11: qty 10

## 2018-04-11 MED ORDER — ONDANSETRON HCL 4 MG/2ML IJ SOLN: 4.0000 mg | INTRAMUSCULAR | Status: DC | PRN

## 2018-04-11 MED ORDER — FENTANYL 2.5 MCG/ML BUPIVACAINE 1/10 % EPIDURAL INFUSION (WH - ANES)
14.0000 mL/h | INTRAMUSCULAR | Status: DC | PRN
  Administered 2018-04-11: 14 mL/h via EPIDURAL
  Filled 2018-04-11: qty 100

## 2018-04-11 MED ORDER — SENNOSIDES-DOCUSATE SODIUM 8.6-50 MG PO TABS
2.0000 | ORAL_TABLET | ORAL | Status: DC
  Administered 2018-04-12: 2 via ORAL
  Filled 2018-04-11: qty 2

## 2018-04-11 MED ORDER — DIBUCAINE 1 % RE OINT: 1.0000 "application " | TOPICAL_OINTMENT | RECTAL | Status: DC | PRN

## 2018-04-11 MED ORDER — SIMETHICONE 80 MG PO CHEW: 80.0000 mg | CHEWABLE_TABLET | ORAL | Status: DC | PRN

## 2018-04-11 NOTE — Anesthesia Preprocedure Evaluation (Signed)
Anesthesia Evaluation  Patient identified by MRN, date of birth, ID band Patient awake    Reviewed: Allergy & Precautions, H&P , NPO status , Patient's Chart, lab work & pertinent test results  History of Anesthesia Complications Negative for: history of anesthetic complications  Airway Mallampati: II  TM Distance: >3 FB Neck ROM: full    Dental no notable dental hx.    Pulmonary neg pulmonary ROS,    Pulmonary exam normal        Cardiovascular negative cardio ROS Normal cardiovascular exam Rhythm:regular Rate:Normal     Neuro/Psych negative neurological ROS  negative psych ROS   GI/Hepatic negative GI ROS, Neg liver ROS,   Endo/Other  Morbid obesity  Renal/GU negative Renal ROS  negative genitourinary   Musculoskeletal   Abdominal   Peds  Hematology negative hematology ROS (+)   Anesthesia Other Findings   Reproductive/Obstetrics (+) Pregnancy                             Anesthesia Physical Anesthesia Plan  ASA: III  Anesthesia Plan: Epidural   Post-op Pain Management:    Induction:   PONV Risk Score and Plan:   Airway Management Planned:   Additional Equipment:   Intra-op Plan:   Post-operative Plan:   Informed Consent: I have reviewed the patients History and Physical, chart, labs and discussed the procedure including the risks, benefits and alternatives for the proposed anesthesia with the patient or authorized representative who has indicated his/her understanding and acceptance.       Plan Discussed with:   Anesthesia Plan Comments:         Anesthesia Quick Evaluation  

## 2018-04-11 NOTE — Anesthesia Procedure Notes (Signed)
Epidural Patient location during procedure: OB Start time: 04/11/2018 1:45 AM End time: 04/11/2018 2:04 AM  Staffing Anesthesiologist: Lucretia KernWitman, Savreen Gebhardt E, MD Performed: anesthesiologist   Preanesthetic Checklist Completed: patient identified, pre-op evaluation, timeout performed, IV checked, risks and benefits discussed and monitors and equipment checked  Epidural Patient position: sitting Prep: DuraPrep Patient monitoring: heart rate, continuous pulse ox and blood pressure Approach: midline Injection technique: LOR air  Needle:  Needle type: Tuohy  Needle gauge: 17 G Needle length: 9 cm Needle insertion depth: 7 cm Catheter type: closed end flexible Catheter size: 19 Gauge Catheter at skin depth: 12 cm  Assessment Events: blood not aspirated, injection not painful, no injection resistance, negative IV test and no paresthesia  Additional Notes Reason for block:procedure for pain

## 2018-04-11 NOTE — Anesthesia Postprocedure Evaluation (Signed)
Anesthesia Post Note  Patient: Angel Hodges  Procedure(s) Performed: AN AD HOC LABOR EPIDURAL     Patient location during evaluation: Mother Baby Anesthesia Type: Epidural Level of consciousness: awake Pain management: pain level controlled Vital Signs Assessment: post-procedure vital signs reviewed and stable Respiratory status: spontaneous breathing Cardiovascular status: stable Postop Assessment: patient able to bend at knees, epidural receding, no backache and no headache Anesthetic complications: no    Last Vitals:  Vitals:   04/11/18 0523 04/11/18 0628  BP: 134/72 (!) 113/55  Pulse: 84 89  Resp: 16 16  Temp: 36.8 C 36.8 C  SpO2: 98% 99%    Last Pain:  Vitals:   04/11/18 0628  TempSrc: Oral  PainSc: 0-No pain   Pain Goal:                 [name],[name]

## 2018-04-11 NOTE — Progress Notes (Signed)
RN continually attempting to obtain BP, post epidural placement. Pt shaking due to pain and BP cuff unable to complete cycle. Several failed attempts prior to reading of 168/95. FHR appears stable, and FM noted during this time.

## 2018-04-12 ENCOUNTER — Other Ambulatory Visit: Payer: Self-pay

## 2018-04-12 MED ORDER — IBUPROFEN 600 MG PO TABS: 600.0000 mg | ORAL_TABLET | Freq: Four times a day (QID) | ORAL | 0 refills | Status: AC

## 2018-04-12 MED ORDER — MEASLES, MUMPS & RUBELLA VAC IJ SOLR
0.5000 mL | Freq: Once | INTRAMUSCULAR | Status: AC
  Administered 2018-04-12: 0.5 mL via SUBCUTANEOUS
  Filled 2018-04-12: qty 0.5

## 2018-04-12 MED ORDER — SENNOSIDES-DOCUSATE SODIUM 8.6-50 MG PO TABS: 2.0000 | ORAL_TABLET | ORAL | 0 refills | Status: AC

## 2018-04-12 NOTE — Discharge Summary (Addendum)
Postpartum Discharge Summary     Patient Name: Angel Hodges DOB: 04/02/1991 MRN: [id number]  Date of admission: 04/10/2018 Delivering Provider: Aviva Signs   Date of discharge: 04/12/2018  Admitting diagnosis: 40wks induction  Intrauterine pregnancy: [redacted]w[redacted]d     Secondary diagnosis:  Principal Problem:   Supervision of other normal pregnancy, antepartum Active Problems:   Morbid obesity (HCC)   Rubella non-immune status, antepartum   GBS (group B Streptococcus carrier), +RV culture, currently pregnant   Elevated BP without diagnosis of hypertension  Additional problems: None     Discharge diagnosis: Term Pregnancy Delivered                                                                                                Post partum procedures:None  Augmentation: AROM, Pitocin and Cytotec  Complications: None  Hospital course:  Induction of Labor With Vaginal Delivery   27 y.o. yo [phone] at [redacted]w[redacted]d was admitted to the hospital 04/10/2018 for induction of labor.  Indication for induction: Gestational hypertension.  Patient had an uncomplicated labor course as follows: Membrane Rupture Time/Date: 2:55 AM ,04/11/2018   Intrapartum Procedures: Episiotomy: None [1]                                         Lacerations:  1st degree [2]  Patient had delivery of a Viable infant.  Information for the patient's newborn:  Dene, Landsberg [[id number]]  Delivery Method: Vaginal, Spontaneous(Filed from Delivery Summary)   04/11/2018  Details of delivery can be found in separate delivery note.  Patient had a routine postpartum course. Patient is discharged home 04/12/18.  Magnesium Sulfate recieved: No BMZ received: No  Physical exam  Vitals:   04/11/18 2302 04/12/18 0544 04/12/18 0900 04/12/18 1335  BP: 140/78 117/67 (!) 108/58 125/67  Pulse: 98 84 86 82  Resp: 16 16  16   Temp: 97.9 F (36.6 C) 97.8 F (36.6 C)  98.4 F (36.9 C)  TempSrc: Oral Oral  Oral  SpO2:       Height:       General: alert, cooperative and no distress Lochia: appropriate Uterine Fundus: firm Incision: N/A DVT Evaluation: No evidence of DVT seen on physical exam. Negative Homan's sign. No cords or calf tenderness. No significant calf/ankle edema. Labs: Lab Results  Component Value Date   WBC 21.2 (H) 04/11/2018   HGB 10.9 (L) 04/11/2018   HCT 33.9 (L) 04/11/2018   MCV 77.4 (L) 04/11/2018   PLT 252 04/11/2018   CMP Latest Ref Rng & Units 04/10/2018  Glucose 70 - 99 mg/dL 71  BUN 6 - 20 mg/dL 13  Creatinine 1.30 - 8.65 mg/dL 7.84  Sodium 696 - 295 mmol/L 135  Potassium 3.5 - 5.1 mmol/L 4.3  Chloride 98 - 111 mmol/L 104  CO2 22 - 32 mmol/L 20(L)  Calcium 8.9 - 10.3 mg/dL 9.2  Total Protein 6.5 - 8.1 g/dL 7.2  Total Bilirubin 0.3 - 1.2 mg/dL 0.3  Alkaline Phos 38 -  126 U/L 129(H)  AST 15 - 41 U/L 20  ALT 0 - 44 U/L 18    Discharge instruction: per After Visit Summary and "Baby and Me Booklet".  After visit meds:  Allergies as of 04/12/2018   No Known Allergies     Medication List    TAKE these medications   COMFORT FIT MATERNITY SUPP MED Misc 1 Device by Does not apply route daily.   ibuprofen 600 MG tablet Commonly known as:  ADVIL,MOTRIN Take 1 tablet (600 mg total) by mouth every 6 (six) hours.   PRENATAL VITAMIN PO Take 1 tablet by mouth daily.   senna-docusate 8.6-50 MG tablet Commonly known as:  Senokot-S Take 2 tablets by mouth daily for 7 days. Start taking on:  04/13/2018       Diet: routine diet  Activity: Advance as tolerated. Pelvic rest for 6 weeks.   Outpatient follow up:4 weeks Follow up Appt:No future appointments. Follow up Visit:   Please schedule this patient for Postpartum visit in: 4 weeks with the following provider: MD For C/S patients schedule nurse incision check in weeks 2 weeks: no High risk pregnancy complicated by: HTN Delivery mode:  SVD Anticipated Birth Control:  OCPs PP Procedures needed: BP check   Schedule Integrated BH visit: no  Newborn Data: Live born female  Birth Weight: 6 lb 4.9 oz (2860 g) APGAR: 8, 9  Newborn Delivery   Birth date/time:  04/11/2018 02:57:00 Delivery type:  Vaginal, Spontaneous     Baby Feeding: Bottle Disposition:home with mother   04/12/2018 Awilda MetroSarah A Peiffer, MD  OB FELLOW DISCHARGE ATTESTATION  I have seen and examined this patient and agree with above documentation in the resident's note.   Gwenevere AbbotNimeka Karene Bracken, MD OB Fellow  04/12/2018, 5:22 PM

## 2018-04-12 NOTE — Progress Notes (Signed)
CSW acknowledged consult and completed chart review.  MOB's pregnancy was [facility] on 01/02/18 [facility]    Infant's UDS was negative and CSW will monitor infant's CDS.  If results are positive without an explanation, CSW will [facility].  There are no barriers to discharge.   Blaine HamperAngel Boyd-Gilyard, MSW, LCSW Clinical Social Work [phone]

## 2018-04-16 ENCOUNTER — Telehealth: Payer: Self-pay | Admitting: Family Medicine

## 2018-04-16 NOTE — Telephone Encounter (Signed)
Called patient with her bp check appt on 12/12 @ 10:15. No answer and LVM.

## 2018-04-17 NOTE — Progress Notes (Signed)
I have reviewed this chart and agree with the RN/CMA assessment and management.    Brailey Buescher C Sandhya Denherder, MD, FACOG Attending Physician, [facility]  

## 2018-04-18 ENCOUNTER — Ambulatory Visit: Payer: Medicaid Other

## 2018-04-19 ENCOUNTER — Inpatient Hospital Stay (HOSPITAL_COMMUNITY)
Admission: RE | Admit: 2018-04-19 | Discharge: 2018-04-19 | Disposition: A | Discharge status: Home / Self Care | Payer: Medicaid Other | Source: Ambulatory Visit | Attending: Obstetrics and Gynecology | Admitting: Obstetrics and Gynecology

## 2018-04-22 ENCOUNTER — Ambulatory Visit (INDEPENDENT_AMBULATORY_CARE_PROVIDER_SITE_OTHER): Payer: Medicaid Other | Admitting: General Practice

## 2018-04-22 VITALS — BP 121/72 | HR 72 | Ht 67.0 in | Wt 286.0 lb

## 2018-04-22 DIAGNOSIS — Z0130 Encounter for examination of blood pressure without abnormal findings: Secondary | ICD-10-CM

## 2018-04-22 NOTE — Progress Notes (Addendum)
Patient presents to office today for BP check following vaginal delivery on 12/5- had gestational HTN. Patient was not sent home on BP medication. Patient denies headaches, dizziness or blurry vision. BP is WNL today. [facility]  Chase CallerCarrie H RN BSN 04/22/18   BP is noted and I approve of plan above  Adam PhenixArnold, James G, MD

## 2018-05-14 ENCOUNTER — Encounter: Payer: Self-pay | Admitting: *Deleted

## 2018-05-16 ENCOUNTER — Encounter: Payer: Self-pay | Admitting: Obstetrics and Gynecology

## 2018-05-16 ENCOUNTER — Ambulatory Visit (INDEPENDENT_AMBULATORY_CARE_PROVIDER_SITE_OTHER): Payer: Medicaid Other | Admitting: Obstetrics and Gynecology

## 2018-05-16 DIAGNOSIS — Z1389 Encounter for screening for other disorder: Secondary | ICD-10-CM

## 2018-05-16 NOTE — Progress Notes (Signed)
Subjective:     Angel Hodges is a 28 y.o. female who presents for a postpartum visit. She is 5 weeks postpartum following a spontaneous vaginal delivery. I have fully reviewed the prenatal and intrapartum course. The delivery was at 40 gestational weeks. Outcome: spontaneous vaginal delivery. Anesthesia: epidural. Postpartum course has been uncomplicated. Baby's course has been uncomplicated. Baby is feeding by bottle - Octavia Heir. Bleeding no bleeding. Bowel function is normal. Bladder function is normal. Patient is not sexually active. Contraception method is none. Postpartum depression screening: negative.  The following portions of the patient's history were reviewed and updated as appropriate: allergies, current medications, past family history, past medical history, past social history, past surgical history and problem list.  Review of Systems Pertinent items are noted in HPI.   Objective:    BP 136/70   Pulse 83   Wt 288 lb 11.2 oz (131 kg)   BMI 45.22 kg/m   General:  alert   Breasts:  negative  Abdomen: soft, non-tender; bowel sounds normal; no masses,  no organomegaly   Vulva:  normal  Vagina: normal vagina  Rectal Exam: Not performed.        Assessment:   Normal postpartum exam. Pap smear not done at today's visit.  Last pap was 01/08/18  Plan:   1. Contraception: none Declined  2. Sutures absent  3. Follow up as needed.     Venia Carbon I, NP 05/16/2018 10:11 AM

## 2019-07-07 IMAGING — US US OB LIMITED
1 series · 14 of 28 positions shown · non-contrast
Comparison: none

CLINICAL DATA: Initial evaluation for acute left upper quadrant
abdominal pain, 4 months pregnant.

EXAM:
LIMITED OBSTETRIC ULTRASOUND

[Series 1: us ob limited · 0.23mm/px · 14 of 39 slices shown]
[im 2/39]
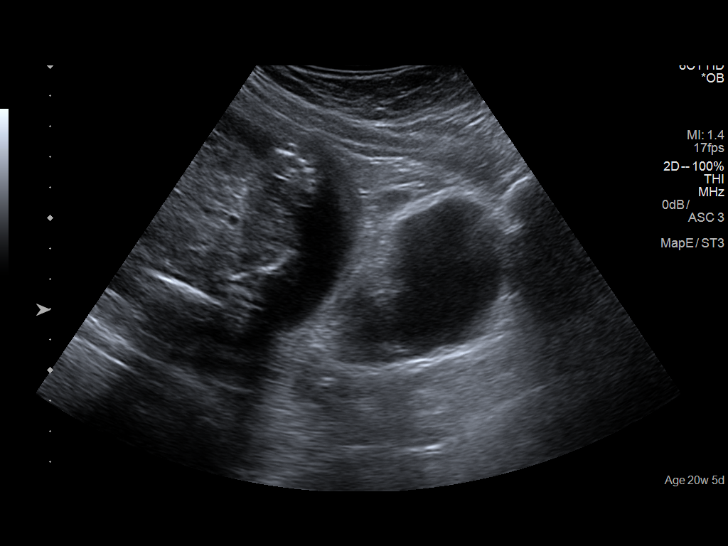
[im 5/39]
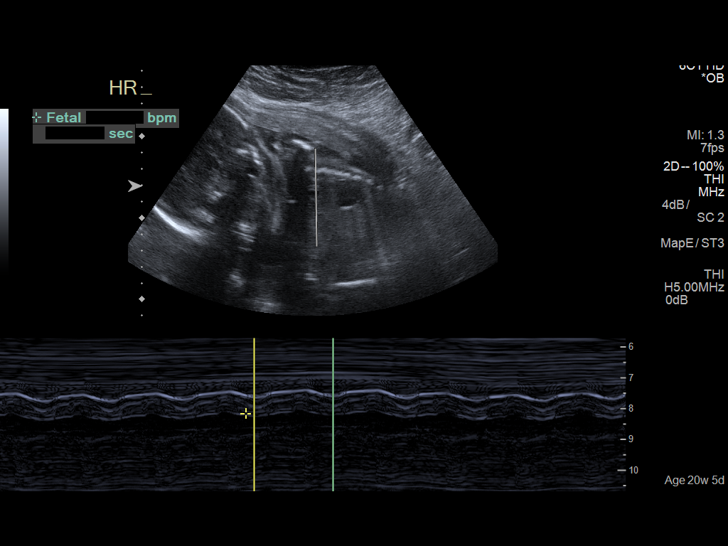
[im 8/39]
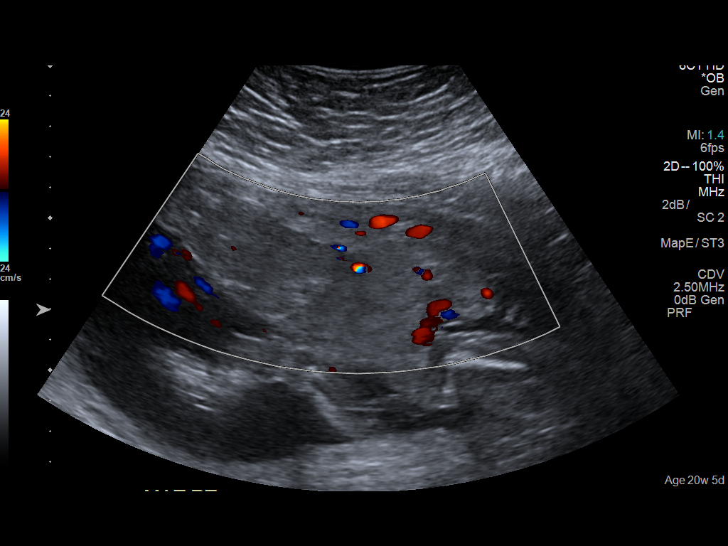
[im 10/39]
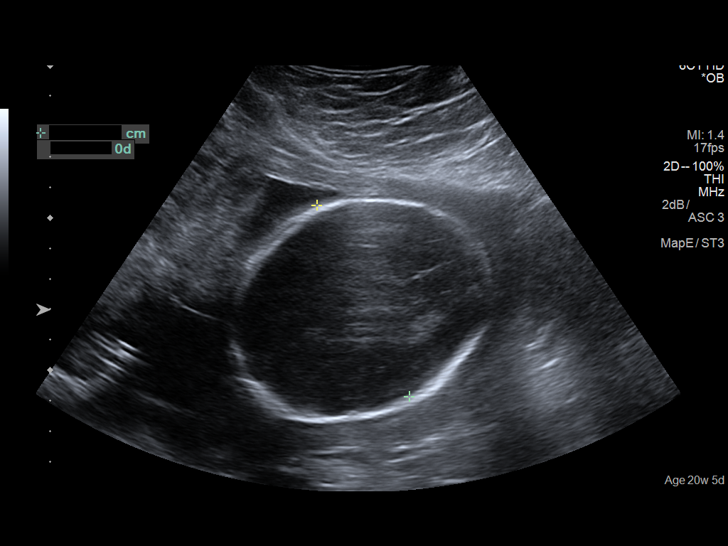
[im 13/39]
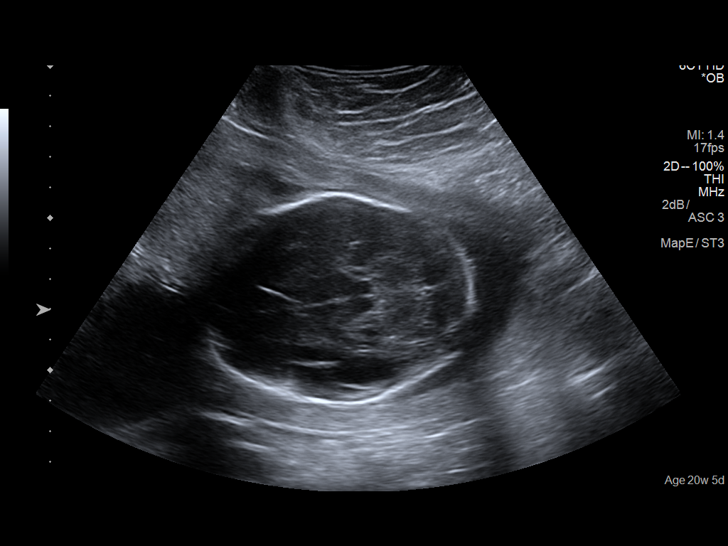
[im 16/39]
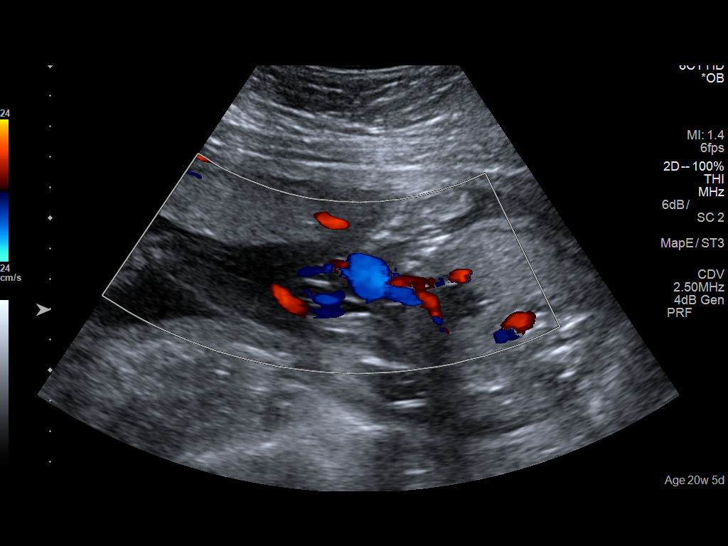
[im 19/39]
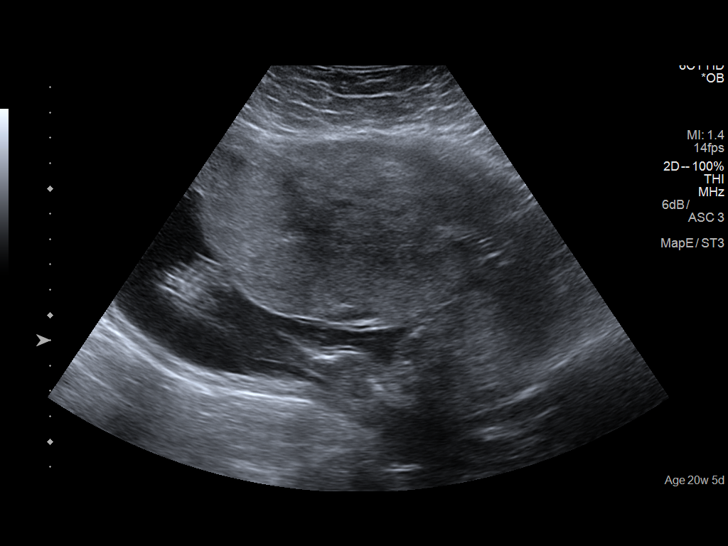
[im 22/39]
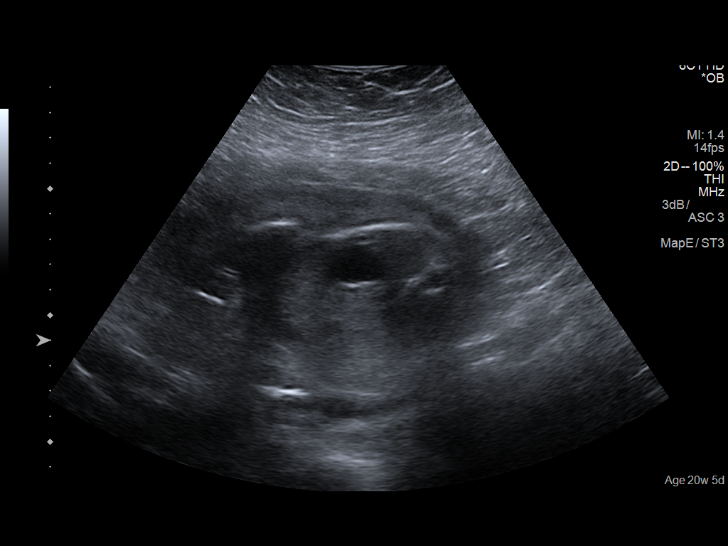
[im 24/39]
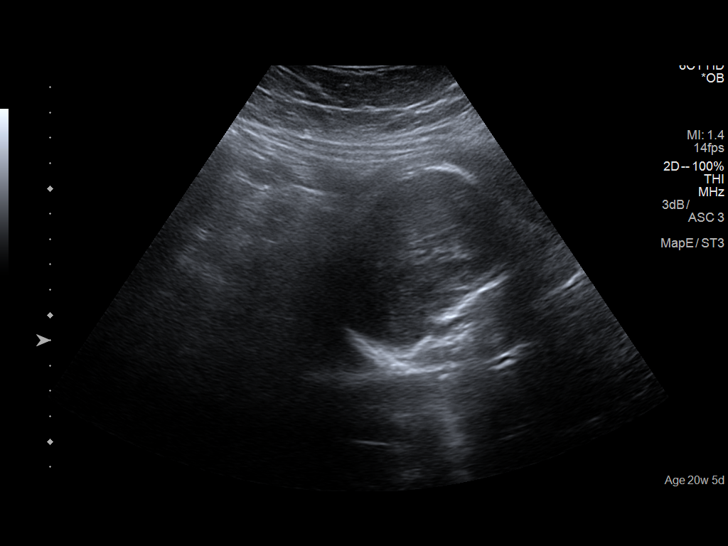
[im 27/39]
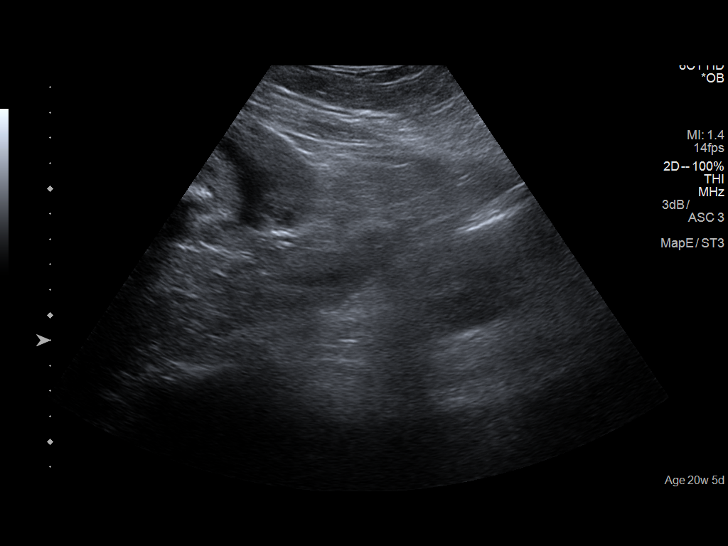
[im 30/39]
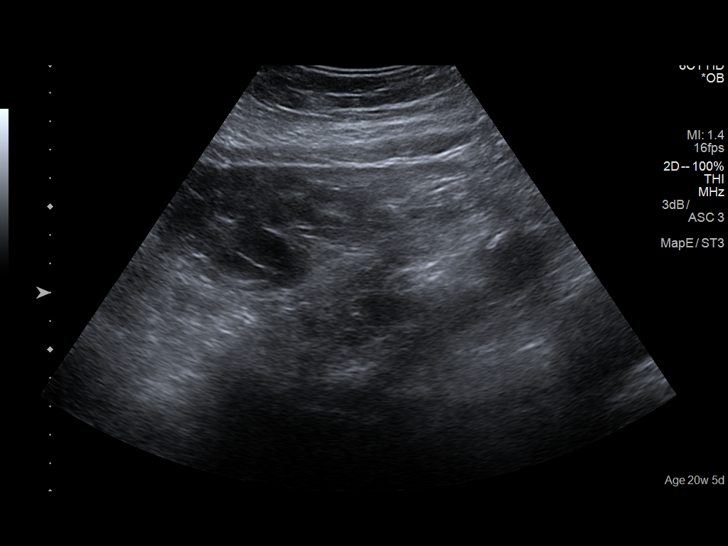
[im 33/39]
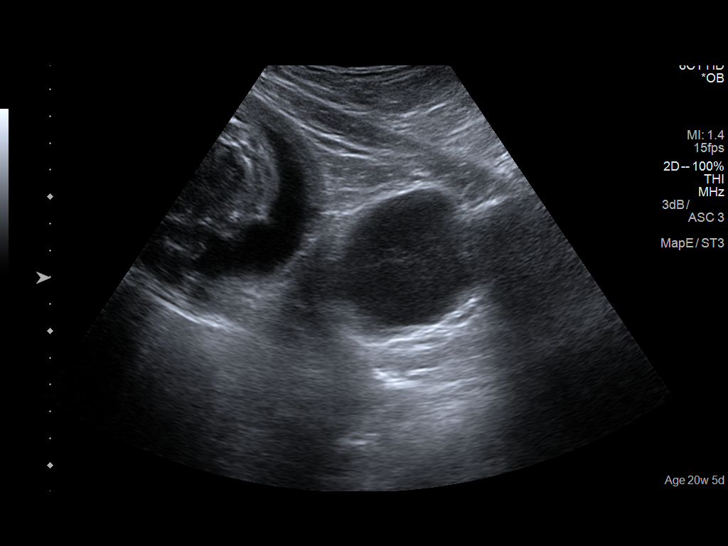
[im 36/39]
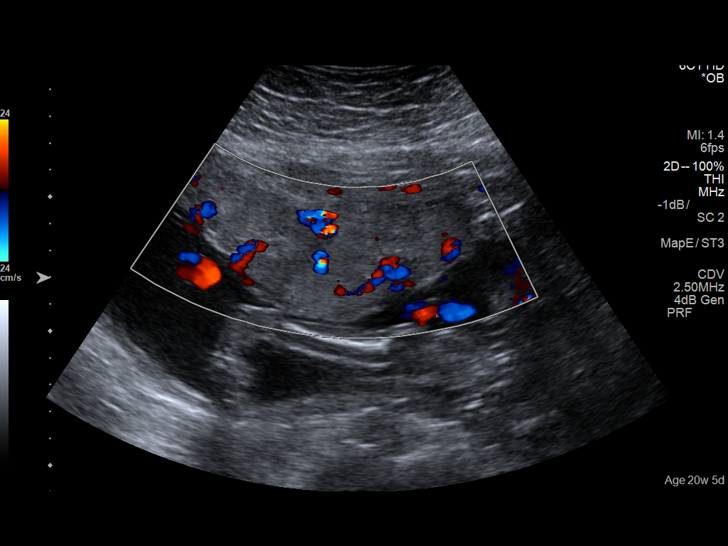
[im 39/39]
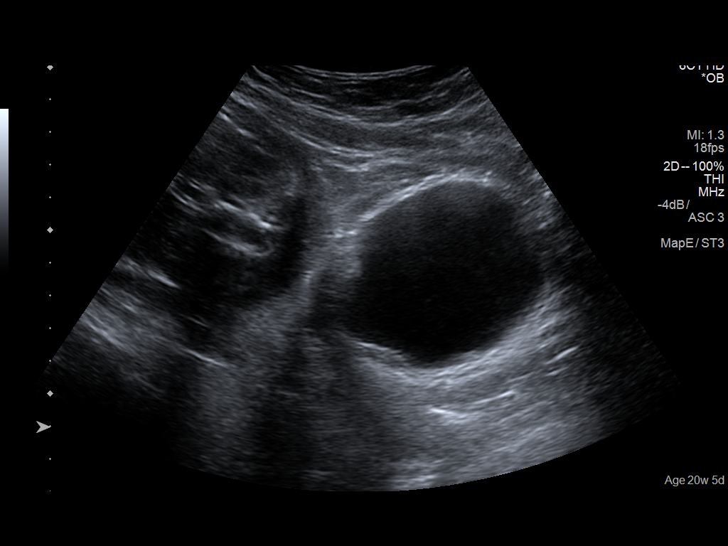

[14 of 28 positions shown; findings below may reference images not displayed]

FINDINGS: Number of Fetuses: 1

Heart Rate:  152 bpm

Movement: Present

Presentation: Breech

Placental Location: Anterior

Previa: Negative.

Amniotic Fluid (Subjective):  Within normal limits.

BPD: 7.02 cm 28 w  1 d

MATERNAL FINDINGS:

Cervix:  Appears closed.

Uterus/Adnexae: No abnormality visualized.
IMPRESSION: Single live intrauterine fetus as detailed above without
complication.

This exam is performed on an emergent basis and does not
comprehensively evaluate fetal size, dating, or anatomy; follow-up
complete OB US should be considered if further fetal assessment is
warranted.
